# Patient Record
Sex: Male | Born: 1975 | Hispanic: No | Smoking: Current every day smoker
Health system: Southern US, Community
[De-identification: ages and names within clinical notes are randomized; demographics above are authoritative.]

## PROBLEM LIST (undated history)

## (undated) DIAGNOSIS — F319 Bipolar disorder, unspecified: Secondary | ICD-10-CM

## (undated) DIAGNOSIS — G8929 Other chronic pain: Secondary | ICD-10-CM

## (undated) DIAGNOSIS — M542 Cervicalgia: Secondary | ICD-10-CM

## (undated) DIAGNOSIS — F209 Schizophrenia, unspecified: Secondary | ICD-10-CM

## (undated) DIAGNOSIS — L732 Hidradenitis suppurativa: Secondary | ICD-10-CM

## (undated) DIAGNOSIS — F4312 Post-traumatic stress disorder, chronic: Secondary | ICD-10-CM

## (undated) DIAGNOSIS — M549 Dorsalgia, unspecified: Secondary | ICD-10-CM

## (undated) HISTORY — PX: HAND SURGERY: SHX662

---

## 2000-08-04 ENCOUNTER — Emergency Department (HOSPITAL_COMMUNITY): Admission: EM | Admit: 2000-08-04 | Discharge: 2000-08-05 | Payer: Self-pay | Admitting: *Deleted

## 2000-08-05 ENCOUNTER — Encounter: Payer: Self-pay | Admitting: *Deleted

## 2000-12-09 ENCOUNTER — Emergency Department (HOSPITAL_COMMUNITY): Admission: EM | Admit: 2000-12-09 | Discharge: 2000-12-09 | Payer: Self-pay | Admitting: Emergency Medicine

## 2001-02-10 ENCOUNTER — Inpatient Hospital Stay (HOSPITAL_COMMUNITY): Admission: EM | Admit: 2001-02-10 | Discharge: 2001-02-14 | Payer: Self-pay | Admitting: Psychiatry

## 2001-02-19 ENCOUNTER — Emergency Department (HOSPITAL_COMMUNITY): Admission: EM | Admit: 2001-02-19 | Discharge: 2001-02-19 | Payer: Self-pay | Admitting: Emergency Medicine

## 2003-07-11 ENCOUNTER — Emergency Department (HOSPITAL_COMMUNITY): Admission: EM | Admit: 2003-07-11 | Discharge: 2003-07-11 | Payer: Self-pay | Admitting: Emergency Medicine

## 2003-08-22 ENCOUNTER — Emergency Department (HOSPITAL_COMMUNITY): Admission: EM | Admit: 2003-08-22 | Discharge: 2003-08-22 | Payer: Self-pay | Admitting: *Deleted

## 2004-02-13 ENCOUNTER — Emergency Department (HOSPITAL_COMMUNITY): Admission: EM | Admit: 2004-02-13 | Discharge: 2004-02-13 | Payer: Self-pay | Admitting: Emergency Medicine

## 2004-07-09 ENCOUNTER — Emergency Department (HOSPITAL_COMMUNITY): Admission: EM | Admit: 2004-07-09 | Discharge: 2004-07-10 | Payer: Self-pay | Admitting: *Deleted

## 2004-07-11 ENCOUNTER — Ambulatory Visit: Payer: Self-pay | Admitting: Family Medicine

## 2007-04-17 ENCOUNTER — Encounter: Payer: Self-pay | Admitting: Family Medicine

## 2009-07-05 ENCOUNTER — Emergency Department (HOSPITAL_COMMUNITY)
Admission: EM | Admit: 2009-07-05 | Discharge: 2009-07-05 | Payer: Self-pay | Source: Home / Self Care | Admitting: Emergency Medicine

## 2009-08-13 ENCOUNTER — Emergency Department (HOSPITAL_COMMUNITY): Admission: EM | Admit: 2009-08-13 | Discharge: 2009-08-13 | Payer: Self-pay | Admitting: Emergency Medicine

## 2010-05-02 ENCOUNTER — Emergency Department (HOSPITAL_COMMUNITY)
Admission: EM | Admit: 2010-05-02 | Discharge: 2010-05-03 | Payer: Self-pay | Source: Home / Self Care | Admitting: Emergency Medicine

## 2010-05-16 NOTE — Letter (Signed)
Summary: rpc chart  rpc chart   Imported By: Curtis Sites 01/30/2010 10:16:06  _____________________________________________________________________  External Attachment:    Type:   Image     Comment:   External Document

## 2010-05-26 ENCOUNTER — Emergency Department (HOSPITAL_COMMUNITY)
Admission: EM | Admit: 2010-05-26 | Discharge: 2010-05-26 | Disposition: A | Discharge status: Other Acute Inpatient Hospital | Payer: Medicare Other | Attending: Emergency Medicine | Admitting: Emergency Medicine

## 2010-05-26 ENCOUNTER — Emergency Department (HOSPITAL_COMMUNITY): Payer: Medicare Other

## 2010-05-26 DIAGNOSIS — W320XXA Accidental handgun discharge, initial encounter: Secondary | ICD-10-CM | POA: Insufficient documentation

## 2010-05-26 DIAGNOSIS — S62309B Unspecified fracture of unspecified metacarpal bone, initial encounter for open fracture: Secondary | ICD-10-CM | POA: Insufficient documentation

## 2010-05-26 LAB — BASIC METABOLIC PANEL
BUN: 8 mg/dL (ref 6–23)
Creatinine, Ser: 0.68 mg/dL (ref 0.4–1.5)
GFR calc non Af Amer: >60 mL/min (ref >60)

## 2010-05-26 LAB — CBC
HCT: 37.4 % — ABNORMAL LOW (ref 39.0–52.0)
Hemoglobin: 13.3 g/dL (ref 13.0–17.0)
MCH: 31.9 pg (ref 26.0–34.0)
MCHC: 35.6 g/dL (ref 30.0–36.0)
MCV: 89.7 fL (ref 78.0–100.0)
Platelets: 189 10*3/uL (ref 150–400)
RBC: 4.17 MIL/uL — ABNORMAL LOW (ref 4.22–5.81)
RDW: 13.1 % (ref 11.5–15.5)
WBC: 8.4 10*3/uL (ref 4.0–10.5)

## 2010-05-26 LAB — DIFFERENTIAL
Basophils Absolute: 0 10*3/uL (ref 0.0–0.1)
Basophils Relative: 0 % (ref 0–1)
Eosinophils Absolute: 0.3 10*3/uL (ref 0.0–0.7)
Eosinophils Relative: 3 % (ref 0–5)
Lymphocytes Relative: 47 % — ABNORMAL HIGH (ref 12–46)
Lymphs Abs: 4 10*3/uL (ref 0.7–4.0)
Monocytes Absolute: 0.7 10*3/uL (ref 0.1–1.0)
Monocytes Relative: 8 % (ref 3–12)
Neutro Abs: 3.4 10*3/uL (ref 1.7–7.7)
Neutrophils Relative %: 41 % — ABNORMAL LOW (ref 43–77)

## 2010-06-10 ENCOUNTER — Emergency Department (HOSPITAL_COMMUNITY)
Admission: EM | Admit: 2010-06-10 | Discharge: 2010-06-10 | Disposition: A | Discharge status: Home / Self Care | Payer: Medicare Other | Attending: Emergency Medicine | Admitting: Emergency Medicine

## 2010-06-10 DIAGNOSIS — F988 Other specified behavioral and emotional disorders with onset usually occurring in childhood and adolescence: Secondary | ICD-10-CM | POA: Insufficient documentation

## 2010-06-10 DIAGNOSIS — Z760 Encounter for issue of repeat prescription: Secondary | ICD-10-CM | POA: Insufficient documentation

## 2010-06-10 DIAGNOSIS — M79609 Pain in unspecified limb: Secondary | ICD-10-CM | POA: Insufficient documentation

## 2010-09-01 NOTE — Discharge Summary (Signed)
[facility]  Patient:    Ricky Campbell, Ricky Campbell Visit Number: [id number] MRN: [id number]          Service Type: EMS Location: ED Attending Physician:  Annamarie Dawley Dictated by:   Jeanice Lim, M.D. Admit Date:  02/19/2001 Discharge Date: 02/19/2001                             Discharge Summary  IDENTIFYING DATA:  This is a 35 year old separated white male voluntarily admitted for depression and suicidal ideation.  History of previous diagnosis of bipolar at [facility] in 1997 and 1998 for depression, history of one suicide attempt.  Followed up at [facility]. Treated with Seroquel.  ALLERGIES:   TETRACYCLINE.  PHYSICAL EXAMINATION:  GENERAL:  Essentially within normal limits.  In no acute distress.  VITAL SIGNS:  Stable, afebrile.  NEUROLOGIC:  Nonfocal, grossly intact.  ROUTINE ADMISSION LABORATORY DATA:  Positive for THC; otherwise normal except for urinalysis which had large hemoglobin.  CBC was within normal limits.  MENTAL STATUS EXAMINATION ON ADMISSION:  The patient was alert and  oriented, neatly dressed with multiple piercings, cooperative, without psychomotor abnormalities.  Speech: Within normal limits.  Mood: Depressed.  Affect: Somewhat restricted.  Thought process: Goal directed.  Thought content: Negative for psychotic symptoms.  He denied acute suicidal, homicidal, or violent ideations.  Judgment and insight were poor based on history. Cognitive: Intact.  ADMITTING DIAGNOSES: Axis I:    1. Bipolar disorder, depressed.            2. Anxiety disorder, not otherwise specified. Axis II:   None. Axis III:  None. Axis IV:   Problems with primary support and other psychosocial problems, mild            to moderate. Axis V:    30/70.  HOSPITAL COURSE:  The patient was admitted and started on routine p.r.n. medications and his previous medications were resumed including  Xanax, Seroquel, and Flexeril.  Xanax was scheduled and Seroquel optimized.  He was started on Trileptal for significant anxiety and lability and tolerated these changes well with no side effects and reported improvement of mood stability and improved judgment and insight.  CONDITION AT DISCHARGE:  Mood was mostly euthymic.  Affect: Brighter.  Thought process: Goal directed.  Thought content: Negative for psychotic symptoms. There was no dangerous ideation and he reported motivated to be compliant with the the followup plan.  DISPOSITION:  He was discharged to follow up with [facility], appointment February 25, 2001, at 9 a.m.  DISCHARGE MEDICATIONS: 1. Xanax 1 mg q.a.m., half at 12 p.m., half at 4 p.m., and one q.h.s. 2. Seroquel 100 mg q.h.s. and 25 mg q.a.m. and 4 p.m. 3. Trileptal 150 mg b.i.d. and two q.h.s. 4. Flexeril 10 mg q.12h. p.r.n.  DISCHARGE DIAGNOSES: Axis I:    1. Bipolar disorder, depressed.            2. Anxiety disorder, not otherwise specified. Axis II:   None. Axis III:  None. Axis IV:   Problems with primary support and other psychosocial problems, mild            to moderate. Axis V:    Global assessment of functioning on discharge 50-55. Dictated by:   Jeanice Lim, M.D. Attending Physician:  Annamarie Dawley DD:  03/21/01 TD:  03/21/01 Job: [id number]
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
## 2016-01-06 ENCOUNTER — Emergency Department
Admission: EM | Admit: 2016-01-06 | Discharge: 2016-01-07 | Disposition: A | Discharge status: Other Acute Inpatient Hospital | Payer: Medicare PPO | Attending: Emergency Medicine | Admitting: Emergency Medicine

## 2016-01-06 ENCOUNTER — Encounter: Payer: Self-pay | Admitting: Urgent Care

## 2016-01-06 DIAGNOSIS — F17200 Nicotine dependence, unspecified, uncomplicated: Secondary | ICD-10-CM | POA: Insufficient documentation

## 2016-01-06 DIAGNOSIS — R079 Chest pain, unspecified: Secondary | ICD-10-CM | POA: Insufficient documentation

## 2016-01-06 DIAGNOSIS — F3162 Bipolar disorder, current episode mixed, moderate: Secondary | ICD-10-CM | POA: Insufficient documentation

## 2016-01-06 DIAGNOSIS — L732 Hidradenitis suppurativa: Secondary | ICD-10-CM | POA: Insufficient documentation

## 2016-01-06 DIAGNOSIS — F1210 Cannabis abuse, uncomplicated: Secondary | ICD-10-CM | POA: Insufficient documentation

## 2016-01-06 DIAGNOSIS — F209 Schizophrenia, unspecified: Secondary | ICD-10-CM | POA: Insufficient documentation

## 2016-01-06 HISTORY — DX: Cervicalgia: M54.2

## 2016-01-06 HISTORY — DX: Schizophrenia, unspecified: F20.9

## 2016-01-06 HISTORY — DX: Other chronic pain: G89.29

## 2016-01-06 HISTORY — DX: Dorsalgia, unspecified: M54.9

## 2016-01-06 HISTORY — DX: Hidradenitis suppurativa: L73.2

## 2016-01-06 HISTORY — DX: Bipolar disorder, unspecified: F31.9

## 2016-01-06 LAB — COMPREHENSIVE METABOLIC PANEL
ALT: 22 U/L (ref 17–63)
AST: 19 U/L (ref 15–41)
Albumin: 4.5 g/dL (ref 3.5–5.0)
Alkaline Phosphatase: 73 U/L (ref 38–126)
Anion gap: 7 (ref 5–15)
BILIRUBIN TOTAL: 0.6 mg/dL (ref 0.3–1.2)
BUN: 7 mg/dL (ref 6–20)
CO2: 30 mmol/L (ref 22–32)
CREATININE: 1.17 mg/dL (ref 0.61–1.24)
Calcium: 9.3 mg/dL (ref 8.9–10.3)
Chloride: 99 mmol/L — ABNORMAL LOW (ref 101–111)
Glucose, Bld: 120 mg/dL — ABNORMAL HIGH (ref 65–99)
POTASSIUM: 4.1 mmol/L (ref 3.5–5.1)
Sodium: 136 mmol/L (ref 135–145)
TOTAL PROTEIN: 7.6 g/dL (ref 6.5–8.1)

## 2016-01-06 LAB — CBC
HCT: 47.8 % (ref 40.0–52.0)
Hemoglobin: 16.6 g/dL (ref 13.0–18.0)
MCH: 31 pg (ref 26.0–34.0)
MCHC: 34.7 g/dL (ref 32.0–36.0)
MCV: 89.2 fL (ref 80.0–100.0)
PLATELETS: 246 10*3/uL (ref 150–440)
RBC: 5.35 MIL/uL (ref 4.40–5.90)
RDW: 13.9 % (ref 11.5–14.5)
WBC: 11.8 10*3/uL — ABNORMAL HIGH (ref 3.8–10.6)

## 2016-01-06 LAB — URINE DRUG SCREEN, QUALITATIVE (ARMC ONLY)
Amphetamines, Ur Screen: NOT DETECTED
BARBITURATES, UR SCREEN: NOT DETECTED
BENZODIAZEPINE, UR SCRN: NOT DETECTED
CANNABINOID 50 NG, UR ~~LOC~~: POSITIVE — AB
Cocaine Metabolite,Ur ~~LOC~~: NOT DETECTED
MDMA (Ecstasy)Ur Screen: NOT DETECTED
Methadone Scn, Ur: NOT DETECTED
Opiate, Ur Screen: POSITIVE — AB
PHENCYCLIDINE (PCP) UR S: NOT DETECTED
Tricyclic, Ur Screen: NOT DETECTED

## 2016-01-06 LAB — TROPONIN I

## 2016-01-06 LAB — ETHANOL

## 2016-01-06 LAB — SALICYLATE LEVEL

## 2016-01-06 LAB — ACETAMINOPHEN LEVEL: Acetaminophen (Tylenol), Serum: <10 ug/mL — ABNORMAL LOW (ref 10–30)

## 2016-01-06 NOTE — ED Triage Notes (Addendum)
Patient presents to the ED tonight asking for refills on his medications. PMH significant for Bipolar and Schizophrenia. Patient is off several medications; requesting: 1. Lamical 2. Effexor 3. Adderall 30mg  BID 4. Xanax 2mg  TID 5. Oxycodone 15mg  8 times a day 6. Keflex  Reports that he has been having chest pain and LUE pain over the last two days. Patient itching, having insomnia (hasnt slept in 3 days), and having A/V hallucinations. Patient has not left his home in months; family member made him come in because of the condition of his home. Patient reports that he has lost a family member this year and his doctor has moved away. Patient reports that he has seen several physicans, but no one will fill his medications; [place].  Being treated for what sounds like hidradenitis suppurativa in his groin; on Keflex.

## 2016-01-06 NOTE — ED Provider Notes (Signed)
[facility] Emergency Department Provider Note   ____________________________________________   First MD Initiated Contact with Patient 01/06/16 2337     (approximate)  I have reviewed the triage vital signs and the nursing notes.   HISTORY  Chief Complaint Medication Refill; Psychiatric Evaluation; Chest Pain; and Recurrent Skin Infections    HPI Ricky Campbell is a 40 y.o. male who presents to the ED from home with the chief complaint of hallucinations. Patient has a history of bipolar disorder and schizophrenia who states he is off of his medications for the past several months. States he is having hallucinations; denies active SI/HI/AH/VH. He is on his second course of Keflex for hidradenitis suppurativa in his right groin.States he has been having chest pains for the past 3 days. Symptoms are not associated with diaphoresis, dizziness, nausea, vomiting. Also reports insomnia; states he has not slept for the past 3 days. Denies recent fever, chills, shortness of breath, abdominal pain, dysuria, diarrhea. Denies recent travel or trauma. Nothing makes his symptoms better or worse. States he has had chest pains previously when he has been off of his medications.   Past Medical History:  Diagnosis Date  . Bipolar disorder (HCC)   . Chronic neck and back pain   . Hidradenitis   . Schizophrenia (HCC)     There are no active problems to display for this patient.   Past Surgical History:  Procedure Laterality Date  . HAND SURGERY      Prior to Admission medications   Not on File    Allergies Tetracyclines & related  No family history on file.  Social History Social History  Substance Use Topics  . Smoking status: Current Every Day Smoker  . Smokeless tobacco: Never Used  . Alcohol use Yes    Review of Systems  Constitutional: No fever/chills. Eyes: No visual changes. ENT: No sore throat. Cardiovascular: Positive for chest  pain. Respiratory: Denies shortness of breath. Gastrointestinal: No abdominal pain.  No nausea, no vomiting.  No diarrhea.  No constipation. Genitourinary: Negative for dysuria. Musculoskeletal: Negative for back pain. Skin: Negative for rash. Neurological: Negative for headaches, focal weakness or numbness. Psychiatric:Positive for hallucinations.  10-point ROS otherwise negative.  ____________________________________________   PHYSICAL EXAM:  VITAL SIGNS: ED Triage Vitals [01/06/16 2150]  Enc Vitals Group     BP (!) 138/102     Pulse Rate 89     Resp 18     Temp 98.1 F (36.7 C)     Temp Source Oral     SpO2 98 %     Weight 180 lb (81.6 kg)     Height 5\' 11"  (1.803 m)     Head Circumference      Peak Flow      Pain Score 8     Pain Loc      Pain Edu?      Excl. in GC?     Constitutional: Alert and oriented. Well appearing and in no acute distress. Eyes: Conjunctivae are normal. PERRL. EOMI. Head: Atraumatic. Nose: No congestion/rhinnorhea. Mouth/Throat: Mucous membranes are moist.  Oropharynx non-erythematous. Neck: No stridor.   Cardiovascular: Normal rate, regular rhythm. Grossly normal heart sounds.  Good peripheral circulation. Respiratory: Normal respiratory effort.  No retractions. Lungs CTAB. Gastrointestinal: Soft and nontender. No distention. No abdominal bruits. No CVA tenderness. Genitourinary: Right groin pain with multiple scabs from hidradenitis. There is no active abscess or fluctuance. Musculoskeletal: No lower extremity tenderness nor edema.  No  joint effusions. Neurologic:  Normal speech and language. No gross focal neurologic deficits are appreciated. No gait instability. Skin:  Skin is warm, dry and intact. No rash noted. Psychiatric: Mood and affect are flat. Speech and behavior are normal.  ____________________________________________   LABS (all labs ordered are listed, but only abnormal results are displayed)  Labs Reviewed    COMPREHENSIVE METABOLIC PANEL - Abnormal; Notable for the following:       Result Value   Chloride 99 (*)    Glucose, Bld 120 (*)    All other components within normal limits  ACETAMINOPHEN LEVEL - Abnormal; Notable for the following:    Acetaminophen (Tylenol), Serum <10 (*)    All other components within normal limits  CBC - Abnormal; Notable for the following:    WBC 11.8 (*)    All other components within normal limits  URINE DRUG SCREEN, QUALITATIVE ([facility] ONLY) - Abnormal; Notable for the following:    Opiate, Ur Screen POSITIVE (*)    Cannabinoid 50 Ng, Ur [name] POSITIVE (*)    All other components within normal limits  ETHANOL  SALICYLATE LEVEL  TROPONIN I  TROPONIN I   ____________________________________________  EKG  ED ECG REPORT I, SUNG,JADE J, the attending physician, personally viewed and interpreted this ECG.   Date: 01/07/2016  EKG Time: 2203  Rate: 89  Rhythm: normal EKG, normal sinus rhythm  Axis: Within normal limits  Intervals:none  ST&T Change: Nonspecific  ____________________________________________  RADIOLOGY  None ____________________________________________   PROCEDURES  Procedure(s) performed: None  Procedures  Critical Care performed: No  ____________________________________________   INITIAL IMPRESSION / ASSESSMENT AND PLAN / ED COURSE  Pertinent labs & imaging results that were available during my care of the patient were reviewed by me and considered in my medical decision making (see chart for details).  40 year old male with a history of bipolar disorder and schizophrenia who is off his medications and having hallucinations. Also complaining of nonspecific chest pain for the past several days. Initial troponin and EKG are unremarkable. Will repeat second troponin. We'll keep patient voluntary pending TTS and psychiatry consult.  Clinical Course  Comment By Time  Spoke with TTS [name] who recommends inpatient admission.  Patient will be kept Voluntary and be admitted to our behavioral health unit. Irean HongJade J Sung, MD 09/23 0340     ____________________________________________   FINAL CLINICAL IMPRESSION(S) / ED DIAGNOSES  Final diagnoses:  Bipolar disorder, current episode mixed, moderate (HCC)  Schizophrenia, unspecified type (HCC)  Marijuana abuse      NEW MEDICATIONS STARTED DURING THIS VISIT:  New Prescriptions   No medications on file     Note:  This document was prepared using Dragon voice recognition software and may include unintentional dictation errors.    Irean HongJade J Sung, MD 01/07/16 [phone]

## 2016-01-06 NOTE — ED Notes (Signed)
Belongings given to patient's sister per his request by EDT; among items were his cell phone and wallet.

## 2016-01-07 ENCOUNTER — Inpatient Hospital Stay
Admit: 2016-01-07 | Discharge: 2016-01-10 | DRG: 885 | Disposition: A | Discharge status: Home / Self Care | Payer: Medicare PPO | Source: Intra-hospital | Attending: Psychiatry | Admitting: Psychiatry

## 2016-01-07 DIAGNOSIS — F17210 Nicotine dependence, cigarettes, uncomplicated: Secondary | ICD-10-CM | POA: Diagnosis present

## 2016-01-07 DIAGNOSIS — Z888 Allergy status to other drugs, medicaments and biological substances status: Secondary | ICD-10-CM | POA: Diagnosis not present

## 2016-01-07 DIAGNOSIS — M549 Dorsalgia, unspecified: Secondary | ICD-10-CM | POA: Diagnosis present

## 2016-01-07 DIAGNOSIS — F3130 Bipolar disorder, current episode depressed, mild or moderate severity, unspecified: Secondary | ICD-10-CM | POA: Diagnosis present

## 2016-01-07 DIAGNOSIS — R45850 Homicidal ideations: Secondary | ICD-10-CM | POA: Diagnosis present

## 2016-01-07 DIAGNOSIS — Z9114 Patient's other noncompliance with medication regimen: Secondary | ICD-10-CM | POA: Diagnosis not present

## 2016-01-07 DIAGNOSIS — F1190 Opioid use, unspecified, uncomplicated: Secondary | ICD-10-CM | POA: Diagnosis present

## 2016-01-07 DIAGNOSIS — F319 Bipolar disorder, unspecified: Principal | ICD-10-CM | POA: Diagnosis present

## 2016-01-07 DIAGNOSIS — F17200 Nicotine dependence, unspecified, uncomplicated: Secondary | ICD-10-CM | POA: Diagnosis present

## 2016-01-07 DIAGNOSIS — Z89022 Acquired absence of left finger(s): Secondary | ICD-10-CM

## 2016-01-07 DIAGNOSIS — R45851 Suicidal ideations: Secondary | ICD-10-CM | POA: Diagnosis present

## 2016-01-07 DIAGNOSIS — F1220 Cannabis dependence, uncomplicated: Secondary | ICD-10-CM | POA: Diagnosis present

## 2016-01-07 DIAGNOSIS — L732 Hidradenitis suppurativa: Secondary | ICD-10-CM | POA: Diagnosis present

## 2016-01-07 DIAGNOSIS — Z9889 Other specified postprocedural states: Secondary | ICD-10-CM | POA: Diagnosis not present

## 2016-01-07 DIAGNOSIS — F4312 Post-traumatic stress disorder, chronic: Secondary | ICD-10-CM

## 2016-01-07 DIAGNOSIS — Z915 Personal history of self-harm: Secondary | ICD-10-CM

## 2016-01-07 DIAGNOSIS — F1120 Opioid dependence, uncomplicated: Secondary | ICD-10-CM | POA: Diagnosis present

## 2016-01-07 DIAGNOSIS — G8929 Other chronic pain: Secondary | ICD-10-CM | POA: Diagnosis present

## 2016-01-07 DIAGNOSIS — F1021 Alcohol dependence, in remission: Secondary | ICD-10-CM | POA: Diagnosis present

## 2016-01-07 DIAGNOSIS — M542 Cervicalgia: Secondary | ICD-10-CM | POA: Diagnosis present

## 2016-01-07 DIAGNOSIS — E7800 Pure hypercholesterolemia, unspecified: Secondary | ICD-10-CM | POA: Diagnosis present

## 2016-01-07 HISTORY — DX: Post-traumatic stress disorder, chronic: F43.12

## 2016-01-07 LAB — TROPONIN I: Troponin I: <0.03 ng/mL (ref <0.03)

## 2016-01-07 MED ORDER — TRAZODONE HCL 100 MG PO TABS: 100.0000 mg | ORAL_TABLET | Freq: Every evening | ORAL | Status: DC | PRN

## 2016-01-07 MED ORDER — MAGNESIUM HYDROXIDE 400 MG/5ML PO SUSP: 30.0000 mL | Freq: Every day | ORAL | Status: DC | PRN

## 2016-01-07 MED ORDER — OXYCODONE HCL 5 MG PO TABS
15.0000 mg | ORAL_TABLET | Freq: Two times a day (BID) | ORAL | Status: DC
  Administered 2016-01-07: 15 mg via ORAL
  Filled 2016-01-07: qty 3

## 2016-01-07 MED ORDER — NICOTINE 21 MG/24HR TD PT24
21.0000 mg | MEDICATED_PATCH | Freq: Every day | TRANSDERMAL | Status: DC
  Administered 2016-01-07 – 2016-01-10 (×4): 21 mg via TRANSDERMAL
  Filled 2016-01-07 (×4): qty 1

## 2016-01-07 MED ORDER — CEPHALEXIN 500 MG PO CAPS
500.0000 mg | ORAL_CAPSULE | Freq: Two times a day (BID) | ORAL | Status: DC
  Administered 2016-01-07 – 2016-01-10 (×7): 500 mg via ORAL
  Filled 2016-01-07 (×7): qty 1

## 2016-01-07 MED ORDER — LORAZEPAM 0.5 MG PO TABS
1.0000 mg | ORAL_TABLET | Freq: Four times a day (QID) | ORAL | Status: DC | PRN
  Administered 2016-01-07 – 2016-01-09 (×2): 1 mg via ORAL
  Filled 2016-01-07 (×2): qty 2

## 2016-01-07 MED ORDER — ACETAMINOPHEN 325 MG PO TABS: 650.0000 mg | ORAL_TABLET | Freq: Four times a day (QID) | ORAL | Status: DC | PRN

## 2016-01-07 MED ORDER — HALOPERIDOL 5 MG PO TABS
5.0000 mg | ORAL_TABLET | Freq: Every day | ORAL | Status: DC
  Administered 2016-01-07 – 2016-01-09 (×3): 5 mg via ORAL
  Filled 2016-01-07 (×3): qty 1

## 2016-01-07 MED ORDER — HYDROCODONE-ACETAMINOPHEN 5-325 MG PO TABS
1.0000 | ORAL_TABLET | Freq: Four times a day (QID) | ORAL | Status: DC | PRN
  Administered 2016-01-07 – 2016-01-10 (×7): 1 via ORAL
  Filled 2016-01-07 (×9): qty 1

## 2016-01-07 MED ORDER — ALUM & MAG HYDROXIDE-SIMETH 200-200-20 MG/5ML PO SUSP: 30.0000 mL | ORAL | Status: DC | PRN

## 2016-01-07 MED ORDER — LAMOTRIGINE 100 MG PO TABS
100.0000 mg | ORAL_TABLET | Freq: Two times a day (BID) | ORAL | Status: DC
  Administered 2016-01-07 – 2016-01-09 (×5): 100 mg via ORAL
  Filled 2016-01-07 (×5): qty 1

## 2016-01-07 MED ORDER — VENLAFAXINE HCL ER 75 MG PO CP24
75.0000 mg | ORAL_CAPSULE | Freq: Every day | ORAL | Status: DC
  Administered 2016-01-07 – 2016-01-08 (×2): 75 mg via ORAL
  Filled 2016-01-07 (×2): qty 1
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
## 2016-01-07 NOTE — H&P (Signed)
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 symptoms so as ready for discharge  Short Term Goals: Ability to identify changes in lifestyle to reduce recurrence of condition will improve, Ability to verbalize feelings will improve, Ability to disclose and discuss suicidal ideas, Ability to demonstrate self-control will improve, Ability to identify and develop effective coping behaviors will improve, Ability to maintain clinical measurements within normal limits will improve, Compliance with prescribed medications will improve and Ability to identify triggers associated with substance abuse/mental health issues will improve  I certify that inpatient services furnished can reasonably be expected to improve the patient's condition.    Jay Schlichter, MD 9/23/20173:08 PM

## 2016-01-07 NOTE — Plan of Care (Signed)
Problem: Safety: Goal: Periods of time without injury will increase Outcome: Progressing Patient has remained free from harm this shift. Patient provided with a safe environment.  Patient safety maintained with 15 minute checks.

## 2016-01-07 NOTE — Tx Team (Addendum)
Initial Treatment Plan 01/07/2016 5:47 AM Ricky Cadethristopher J Bergdoll EPP:[id number]    PATIENT STRESSORS: Medication change or noncompliance   PATIENT STRENGTHS: Ability for insight Average or above average intelligence Communication skills   PATIENT IDENTIFIED PROBLEMS:   Depression  Medication management       "Depression"  "help obtaining and getting on right medications"         DISCHARGE CRITERIA:  Improved stabilization in mood, thinking, and/or behavior  PRELIMINARY DISCHARGE PLAN: Outpatient therapy Return to previous living arrangement  PATIENT/FAMILY INVOLVEMENT: This treatment plan has been presented to and reviewed with the patient, Ricky Campbell, and/or family member, .  The patient and family have been given the opportunity to ask questions and make suggestions.  Ernesto RutherfordKristen Marsia Cino, RN 01/07/2016, 5:47 AM

## 2016-01-07 NOTE — [facility] Group Notes (Signed)
[facility] LCSW Group Therapy  01/07/2016 2:15 PM  Type of Therapy:  Group Therapy  Participation Level:  Patient did not attend group. CSW invited patient to group.   Summary of Progress/Problems: Communications: Patients identify how individuals communicate with one another appropriately and inappropriately. Patients will be guided to discuss their thoughts, feelings, and behaviors related to barriers when communicating. The group will process together ways to execute positive and appropriate communications.   Ricky Campbell G. Garnette CzechSampson MSW, LCSWA 01/07/2016, 2:18 PM
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
## 2016-01-07 NOTE — Progress Notes (Signed)
D:  Patient isolated himself to his room.  Patient denies SI/AVH/HI. A:  Patient encouraged to attend groups.  Patient administered scheduled medications. R:  Patient safety maintained with 15 minute checks.  Patient did not attend groups.  Patient medication compliant.

## 2016-01-07 NOTE — ED Notes (Signed)
Reports called to lower level behavioral.  Pt accepted.

## 2016-01-07 NOTE — B[facility] Counselor (Signed)
CSW went to patient's room to meet and complete psychosocial assessment. Patient stated he was feeling nauseous and reports he had not been able to sleep when he was in the ED. Patient declines to complete assessment today. CSW will attempt to complete psychosocial assessment tomorrow.   Kendall Arnell G. Garnette CzechSampson MSW, [facility] Lake[facility]WA 01/07/2016 3:45 PM

## 2016-01-08 LAB — TSH: TSH: 1.324 u[IU]/mL (ref 0.350–4.500)

## 2016-01-08 LAB — LIPID PANEL
CHOL/HDL RATIO: 7.1 ratio
CHOLESTEROL: 249 mg/dL — AB (ref 0–200)
HDL: 35 mg/dL — ABNORMAL LOW (ref >40)
LDL Cholesterol: 167 mg/dL — ABNORMAL HIGH (ref 0–99)
Triglycerides: 236 mg/dL — ABNORMAL HIGH (ref <150)
VLDL: 47 mg/dL — ABNORMAL HIGH (ref 0–40)

## 2016-01-08 MED ORDER — VENLAFAXINE HCL ER 75 MG PO CP24
150.0000 mg | ORAL_CAPSULE | Freq: Every day | ORAL | Status: DC
  Administered 2016-01-09 – 2016-01-10 (×2): 150 mg via ORAL
  Filled 2016-01-08 (×2): qty 2

## 2016-01-08 NOTE — [facility] Group Notes (Signed)
[facility] Group Notes:  (Nursing/MHT/Case Management/Adjunct)  Date:  01/08/2016  Time:  4:06 AM  Type of Therapy:  Psychoeducational Skills  Participation Level:  Active  Participation Quality:  Appropriate, Attentive and Sharing  Affect:  Appropriate  Cognitive:  Appropriate  Insight:  Appropriate  Engagement in Group:  Engaged  Modes of Intervention:  Discussion, Socialization and Support  Summary of Progress/Problems:  Chancy MilroyLaquanda Y Hiilei Gerst 01/08/2016, 4:06 AM

## 2016-01-08 NOTE — Plan of Care (Signed)
Problem: Activity: Goal: Interest or engagement in leisure activities will improve Outcome: Not Progressing Does not attend groups. Isolative to room unless meal or medication time. Noted to be in bed with eyes closed most of the day. Support and encouragement provided. Will continue to monitor.

## 2016-01-08 NOTE — Progress Notes (Signed)
Pt denies SI/HI/AVH. Affect irritable and Pt states it is because " I came here to get my medicine straight, not for it to be messed up even more". Attended evening group.  Rated pain 10/10. Stated that his prescribed PRN medication would not work. Currently resting in bed with eyes closed. Voices no additional concerns at this time. Safety maintained.

## 2016-01-08 NOTE — [facility] Suicide Risk Assessment (Signed)
[facility] INPATIENT:  Family/Significant Other Suicide Prevention Education  Suicide Prevention Education:  Education Completed;Ricky Campbell (sister [phone]), has been identified by the patient as the family member/significant other with whom the patient will be residing, and identified as the person(s) who will aid the patient in the event of a mental health crisis (suicidal ideations/suicide attempt).  With written consent from the patient, the family member/significant other has been provided the following suicide prevention education, prior to the and/or following the discharge of the patient. Sister confirmed that patients son is living with her temporarily and that the son is safe. Sister confirms that patients living arrangement is safe and that he has no financial or housing stressors. Sister confirms that she will pick patient up at discharge.    The suicide prevention education provided includes the following:  Suicide risk factors  Suicide prevention and interventions  National Suicide Hotline telephone number  [facility] assessment telephone number  [place] Emergency Assistance 911  [facility] and/or Residential Mobile Crisis Unit telephone number  Request made of family/significant other to:  Remove weapons (e.g., guns, rifles, knives), all items previously/currently identified as safety concern.    Remove drugs/medications (over-the-counter, prescriptions, illicit drugs), all items previously/currently identified as a safety concern.  The family member/significant other verbalizes understanding of the suicide prevention education information provided.  The family member/significant other agrees to remove the items of safety concern listed above.  Ricky Campbell MSW, LCSWA 01/08/2016, 10:48 AM

## 2016-01-08 NOTE — [facility] Group Notes (Signed)
[facility] LCSW Group Therapy  01/08/2016 2:02 PM  Type of Therapy:  Group Therapy  Participation Level:  Patient did not attend group. CSW invited patient to group.   Summary of Progress/Problems:Self esteem: Patients discussed self esteem and how it impacts them. They discussed what aspects in their lives has influenced their self esteem. They were challenged to identify changes that are needed in order to improve self esteem. Patients participated in activity where they had to identify positive adjectives they felt described their personality. Patients shared with the group on the following areas: Things I am good at, What I like about my appearance, I've helped others by, What I value the most, compliments I have received, challenges I have overcome, thing that make me unique, and Times I've made others happy.    Shanta Hartner G. Garnette CzechSampson MSW, LCSWA 01/08/2016, 2:04 PM

## 2016-01-08 NOTE — Progress Notes (Signed)
Quiet, isolative today. Leaves room for meds and meals only. Denies SI/HI/AVH. Reports pain consistent at a 7/10 to lower back. Continues to report that he's here "to get his meds straight," but reports "all my meds are messed up now that I've come here."  Does not attend groups. Medication compliant. Safety maintained.   Support and encouragement provided. Medications administered as ordered to include PRN pain med. Encouraged group attendance. Checks every 15 minutes for safety. Will continue to monitor.
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
## 2016-01-09 DIAGNOSIS — F3130 Bipolar disorder, current episode depressed, mild or moderate severity, unspecified: Secondary | ICD-10-CM

## 2016-01-09 LAB — HEMOGLOBIN A1C
Hgb A1c MFr Bld: 5.5 % (ref 4.8–5.6)
Mean Plasma Glucose: 111 mg/dL

## 2016-01-09 MED ORDER — LAMOTRIGINE 100 MG PO TABS
200.0000 mg | ORAL_TABLET | Freq: Two times a day (BID) | ORAL | Status: DC
  Administered 2016-01-09 – 2016-01-10 (×2): 200 mg via ORAL
  Filled 2016-01-09 (×2): qty 2

## 2016-01-09 NOTE — Progress Notes (Signed)
Recreation Therapy Notes  Date: 09.25.17 Time: 1:00 pm Location: Craft Room  Group Topic: Wellness  Goal Area(s) Addresses:  Patient will identify at least one item per dimension of health. Patient will examine areas they are deficient in.  Behavioral Response: Did not attend  Intervention: 6 Dimensions of Health  Activity: Patients were definition sheets of the 6 dimensions of health and a worksheet with the dimensions on it. Patients were instructed to write 2-3 items in each category they were currently doing.  Education: LRT educated patients on ways to improve each dimension.  Education Outcome: Patient did not attend group.   Clinical Observations/Feedback: Patient did not attend group.  Jacquelynn CreeGreene,Marycruz Boehner M, LRT/CTRS 01/09/2016 3:24 PM

## 2016-01-09 NOTE — Plan of Care (Signed)
Problem: Safety: Goal: Ability to remain free from injury will improve Outcome: Progressing Patient denies SI and has remained free from injury during this shift.

## 2016-01-09 NOTE — Plan of Care (Signed)
Problem: Activity: Goal: Imbalance in normal sleep/wake cycle will improve Outcome: Progressing Pt reports much better sleep, and contributes his lack of sleep before admission to taking Effexor at night, and to being reason for admission. Support and encouragement provided. Will continue to monitor.
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
## 2016-01-09 NOTE — Progress Notes (Signed)
D: Patient with a blunted affect. Denies SI/HI/AVH. States he's here because he needed his medications fixed. Also states he's ready for discharge. Rates lower back pain at an 8. Pleasant. Went to group.  A: Medication given with education. PRN hydrocodone given. Encouragement provided.  R: Patient was compliant with medication. He has remained calm and cooperative. Safety maintained with 15 min checks.

## 2016-01-09 NOTE — Progress Notes (Signed)
D: Pt denies SI/HI/AVH. Pt is pleasant and cooperative, no bizarre behavior noted. Pt appears less anxious and he is interacting with peers and staff appropriately.  A: Pt was offered support and encouragement. Pt was given scheduled medications. Pt was encouraged to attend groups. Q 15 minute checks were done for safety.  R:Pt attends groups and interacts well with peers and staff. Pt is taking medication. Pt has no complaints.Pt receptive to treatment and safety maintained on unit.

## 2016-01-09 NOTE — Progress Notes (Signed)
Recreation Therapy Notes  INPATIENT RECREATION THERAPY ASSESSMENT  Patient Details Name: Ricky CadetChristopher J Campbell MRN: [id number] DOB: 02-Apr-1976 Today's Date: 01/09/2016  Patient Stressors:  Patient reported no stressors.  Coping Skills:   Avoidance, Exercise, Art/Dance, Music, Sports, Other (Comment) ([place]ide [place] and clear head)  Personal Challenges: Concentration, Trusting Others (Patient stated he had ADD and he trusts people until they prove him wrong)  Leisure Interests (2+):  Individual - Other (Comment) (Ride bike, work on cars)  Biochemist, clinicalAwareness of Community Resources:  Yes  Community Resources:  [place]  Current Use: Yes  If no, Barriers?:    Patient Strengths:  Good father, helpful  Patient Identified Areas of Improvement:  Being able to let his mind shut down so he can sleep and not stress over little things when he is trying to sleep  Current Recreation Participation:  Riding bike, hanging out with friends, spend time with son  Patient Goal for Hospitalization:  To get medications right  [place]  [place] Residence[place]   Current [place] (including self-harm):  No  Current HI:  No  Consent to Intern Participation: N/A   Ricky Campbell,Ricky Campbell, LRT/CTRS 01/09/2016, 3:55 PM

## 2016-01-09 NOTE — BHH Group Notes (Signed)
[facility] LCSW Group Therapy   01/09/2016 9:30 am Type of Therapy: Group Therapy   Participation Level: Pt invited but did not attend.   Participation Quality: Pt invited but did not attend.  Hampton AbbotKadijah Nyelli Samara, MSW, LCSWA 01/09/2016, 11:02AM

## 2016-01-09 NOTE — Plan of Care (Signed)
Problem: Coping: Goal: Ability to verbalize feelings will improve Outcome: Progressing Patient verbalized feelings.    

## 2016-01-09 NOTE — Progress Notes (Signed)
Pt awake, alert, up on unit today interacting appropriately with peers. Much improvement noted in being out of room and interacting. Continues to report lower back pain rated 7/10. Attended group. Reports feeling much better, brighter. Admits he had been taking his Effexor night, and blames this for no sleep that resulted to admission. Safety maintained. Medication compliant without complaint.   Support and encouragement provided. Every 15 minute checks for safety. Medications administered. Will continue to monitor.

## 2016-01-10 MED ORDER — LAMOTRIGINE 100 MG PO TABS: 200.0000 mg | ORAL_TABLET | Freq: Every day | ORAL | Status: DC

## 2016-01-10 MED ORDER — NALOXONE HCL 4 MG/0.1ML NA LIQD: NASAL | 1 refills | Status: DC

## 2016-01-10 MED ORDER — LAMOTRIGINE 200 MG PO TABS: 200.0000 mg | ORAL_TABLET | Freq: Every day | ORAL | 1 refills | Status: DC

## 2016-01-10 MED ORDER — HALOPERIDOL 5 MG PO TABS: 5.0000 mg | ORAL_TABLET | Freq: Every day | ORAL | 1 refills | Status: DC

## 2016-01-10 MED ORDER — CEPHALEXIN 500 MG PO CAPS: 500.0000 mg | ORAL_CAPSULE | Freq: Two times a day (BID) | ORAL | 0 refills | Status: DC

## 2016-01-10 MED ORDER — VENLAFAXINE HCL ER 150 MG PO CP24: 150.0000 mg | ORAL_CAPSULE | Freq: Every day | ORAL | 1 refills | Status: DC

## 2016-01-10 NOTE — [facility] Suicide Risk Assessment (Signed)
[facility]   Principal Problem: Bipolar I disorder, most recent episode depressed [facility](HCC) Discharge Diagnoses:  Patient Active Problem List   Diagnosis Date Noted  . Bipolar I disorder, most recent episode depressed (HCC) [F31.30] 01/07/2016  . Back pain [M54.9] 01/07/2016  . Hydradenitis [L73.2] 01/07/2016  . Cannabis use disorder, moderate, dependence (HCC) [F12.20] 01/07/2016  . Opioid use disorder, moderate, dependence (HCC) [F11.20] 01/07/2016  . Tobacco use disorder [F17.200] 01/07/2016  . Chronic post-traumatic stress disorder (PTSD) [F43.12] 01/07/2016    Total Time spent with patient: 30 minutes  Musculoskeletal: Strength & Muscle Tone: within normal limits Gait & Station: normal Patient leans: N/A  Psychiatric Specialty Exam: Review of Systems  All other systems reviewed and are negative.   Blood pressure 127/69, pulse 62, temperature 98.3 F (36.8 C), temperature source Oral, resp. rate 18, height 5\' 11"  (1.803 m), weight 81.6 kg (180 lb), SpO2 100 %.Body mass index is 25.1 kg/m.  General Appearance: Casual  Eye Contact::  Good  Speech:  Clear and Coherent409  Volume:  Normal  Mood:  Euthymic  Affect:  Appropriate  Thought Process:  Goal Directed  Orientation:  Full (Time, Place, and Person)  Thought Content:  WDL  Suicidal Thoughts:  No  Homicidal Thoughts:  No  Memory:  Immediate;   Fair Recent;   Fair Remote;   Fair  Judgement:  Impaired  Insight:  Shallow  Psychomotor Activity:  Normal  Concentration:  Fair  Recall:  [employer]  Fund of Knowledge:Fair  Language: Fair  Akathisia:  No  Handed:  Right  AIMS (if indicated):     Assets:  Communication Skills Desire for Improvement Financial Resources/Insurance Housing Resilience Social Support Transportation  Sleep:  Number of Hours: 7.25  Cognition: WNL  ADL's:  Intact   Mental Status Per Nursing Assessment::   On Admission:     Demographic Factors:  Male, Divorced or  widowed, Caucasian and Living alone  Loss Factors: NA  Historical Factors: Prior suicide attempts, Family history of mental illness or substance abuse and Impulsivity  Risk Reduction Factors:   Responsible for children under 42 years of age, Sense of responsibility to family and Positive social support  Continued Clinical Symptoms:  Bipolar Disorder:   Depressive phase Alcohol/Substance Abuse/Dependencies  Cognitive Features That Contribute To Risk:  None    Suicide Risk:  Minimal: No identifiable suicidal ideation.  Patients presenting with no risk factors but with morbid ruminations; may be classified as minimal risk based on the severity of the depressive symptoms  Follow-up Information    [facility]. Go today.   Contact information: [address] [place], [place] [address] Mailing Address: [address], [place], [place] [address]  Hours: Mon-Fri 8AM-5PM  Phone: [phone]  Fax: [phone]           Plan Of Car[place] tolerated.ollow-up recommendations:  Activity:  as tolerated. Diet:  low sodium heart healthy. Other:  keep follow up appointments.  Kristine LineaJolanta Kamal Jurgens, MD 01/10/2016, 9:35 AM

## 2016-01-10 NOTE — Progress Notes (Signed)
  [facility] Discharge Plan :  Will you be returning to the same living situation after discharge:  Yes,    At discharge, do you have transportation home?: Yes,    Do you have the ability to pay for your medications: Yes,     Release of information consent forms completed and in the chart;  Patient's signature needed at discharge.  Patient to Follow up at: Follow-up Information    [facility]. Go on 01/12/2016.   Why:  Please arrive between 8am-9am for hospital follow up appointment for medication management and therapy. Contact information: [address] [place], [place] [address] Mailing Address: [address], [place], [place] [address]  Hours: Mon-Fri 8AM-5PM  Phone: [phone]  Fax: [phone]           Next level of care provider has access to [facility] Link:no  Safety Planning and Suicide Prevention discussed: Yes,     Have you used any form of tobacco in the last 30 days? (Cigarettes, Smokeless Tobacco, Cigars, and/or Pipes): Yes  Has patient been referred to the Quitline?: Patient refused referral  Patient has been referred for addiction treatment: Yes  Glennon MacSara P Thom Ollinger, MSW, LCSW 01/10/2016, 4:55 PM

## 2016-01-10 NOTE — BHH Group Notes (Signed)
[facility] LCSW Group Therapy   01/10/2016 1pm  Type of Therapy: Group Therapy   Participation Level: Pt invited but did not attend.   Participation Quality: Pt invited but did not attend.   Ricky Campbell, MSW, LCSWA 01/10/2016, 2:20PM

## 2016-01-10 NOTE — Progress Notes (Signed)
Recreation Therapy Notes  INPATIENT RECREATION TR PLAN  Patient Details Name: Ricky Campbell MRN: [id number] DOB: July 12, 1975 Today's Date: 01/10/2016  Rec Therapy Plan Is patient appropriate for Therapeutic Recreation?: Yes Treatment times per week: At least once a week TR Treatment/Interventions: 1:1 session, Group participation (Comment) (Appropriate participation in daily recreational therapy tx)  Discharge Criteria Pt will be discharged from therapy if:: Treatment goals are met, Discharged Treatment plan/goals/alternatives discussed and agreed upon by:: Patient/family  Discharge Summary Short term goals set: See Care Plan Short term goals met: Complete Progress toward goals comments: One-to-one attended One-to-one attended: Stress Management Reason goals not met: N/A Therapeutic equipment acquired: None Reason patient discharged from therapy: Discharge from hospital Pt/family agrees with progress & goals achieved: Yes Date patient discharged from therapy: 01/10/16   Leonette Monarch, LRT/CTRS 01/10/2016, 2:50 PM

## 2016-01-10 NOTE — Plan of Care (Signed)
Problem: [facility] Participation in Recreation Therapeutic Interventions Goal: STG-Other Recreation Therapy Goal (Specify) STG: Stress Management - Within 3 treatment sessions, patient will verbalize understanding of the stress management techniques in one treatment session to increase stress management skills post d/c.  Outcome: Completed/Met Date Met: 01/10/16 Treatment Session 1; Completed 1 out of 1: At approximately 11:50 am, LRT met with patient in craft room. LRT educated and provided patient with handouts on stress management techniques. Patient verbalized understanding. LRT encouraged patient to read over and practice the stress management techniques.  Leonette Monarch, LRT/CTRS 09.26.17 12:03 pm

## 2016-01-10 NOTE — Progress Notes (Signed)
D:Patient aware of discharge this shift . Patient returning home . Patient received all belonging locked up . Patient denies  Suicidal  And homicidal ideations  .  A: Writer instructed on discharge criteria  . Informed  Of Discharge [place] Suicide Risk Assessment  Transitional   Record and prescriptions  given to patient . Aware  Of follow up appointment . R: Patient left unit with no questions  Or concerns  With family friends.

## 2016-01-10 NOTE — Plan of Care (Signed)
Problem: Coping: Goal: Ability to verbalize frustrations and anger appropriately will improve Outcome: Progressing Working on coping skills    

## 2016-01-10 NOTE — Tx Team (Signed)
Interdisciplinary Treatment and Diagnostic Plan Update  01/10/2016 Time of Session: 10:30am Ricky Campbell MRN: [id number]  Principal Diagnosis: Bipolar I disorder, most recent episode depressed (HCC)  Secondary Diagnoses: Principal Problem:   Bipolar I disorder, most recent episode depressed (HCC) Active Problems:   Back pain   Hydradenitis   Cannabis use disorder, moderate, dependence (HCC)   Opioid use disorder, moderate, dependence (HCC)   Tobacco use disorder   Chronic post-traumatic stress disorder (PTSD)   Current Medications:  Current Facility-Administered Medications  Medication Dose Route Frequency Provider Last Rate Last Dose  . acetaminophen (TYLENOL) tablet 650 mg  650 mg Oral Q6H PRN Jolanta B Pucilowska, MD      . alum & mag hydroxide-simeth (MAALOX/MYLANTA) 200-200-20 MG/5ML suspension 30 mL  30 mL Oral Q4H PRN Jolanta B Pucilowska, MD      . cephALEXin (KEFLEX) capsule 500 mg  500 mg Oral BID Darliss Ridgel, MD   500 mg at 01/10/16 0829  . haloperidol (HALDOL) tablet 5 mg  5 mg Oral QHS Darliss Ridgel, MD   5 mg at 01/09/16 2132  . HYDROcodone-acetaminophen (NORCO/VICODIN) 5-325 MG per tablet 1 tablet  1 tablet Oral Q6H PRN Darliss Ridgel, MD   1 tablet at 01/10/16 0829  . [START ON 01/11/2016] lamoTRIgine (LAMICTAL) tablet 200 mg  200 mg Oral QHS Jolanta B Pucilowska, MD      . magnesium hydroxide (MILK OF MAGNESIA) suspension 30 mL  30 mL Oral Daily PRN Jolanta B Pucilowska, MD      . nicotine (NICODERM CQ - dosed in mg/24 hours) patch 21 mg  21 mg Transdermal Daily Darliss Ridgel, MD   21 mg at 01/10/16 0829  . venlafaxine XR (EFFEXOR-XR) 24 hr capsule 150 mg  150 mg Oral Q breakfast Darliss Ridgel, MD   150 mg at 01/10/16 4098   Current Outpatient Prescriptions  Medication Sig Dispense Refill  . cephALEXin (KEFLEX) 500 MG capsule Take 1 capsule (500 mg total) by mouth 2 (two) times daily. 14 capsule 0  . haloperidol (HALDOL) 5 MG tablet Take 1 tablet (5 mg  total) by mouth at bedtime. 30 tablet 1  . [START ON 01/11/2016] lamoTRIgine (LAMICTAL) 200 MG tablet Take 1 tablet (200 mg total) by mouth at bedtime. 30 tablet 1  . naloxone HCl 4 MG/0.1ML LIQD Use as directed in case of opiate overdose. 1 each 1  . [START ON 01/11/2016] venlafaxine XR (EFFEXOR-XR) 150 MG 24 hr capsule Take 1 capsule (150 mg total) by mouth daily with breakfast. 30 capsule 1   PTA Medications: No prescriptions prior to admission.    Patient Stressors:  Loss of Job, Chronic Pain, Substance Use  Patient Strengths:  Family Support, Housing, Some Insight  Treatment Modalities: Medication Management, Group therapy, Case management,  1 to 1 session with clinician, Psychoeducation, Recreational therapy.   Physician Treatment Plan for Primary Diagnosis: Bipolar I disorder, most recent episode depressed (HCC) Long Term Goal(s): Ability to identify changes in lifestyle to reduce recurrance of condition will improve. Ability to verbalize feelings will improve. Ability to disclose and discuss suicidal ideas will improve, ability to demonstrate self-control will improve. Ability to maintain clinical measurements within normal limits will improve. Compliance with prescribed medications will improve.  Short Term Goals:   Ability to identify changes in lifestyle to reduce recurrance of condition will improve. Ability to verbalize feelings will improve. Ability to disclose and discuss suicidal ideas will improve, ability to demonstrate self-control will  improve. Ability to maintain clinical measurements within normal limits will improve. Compliance with prescribed medications will improve.  Medication Management: Evaluate patient's response, side effects, and tolerance of medication regimen.  Therapeutic Interventions: 1 to 1 sessions, Unit Group sessions and Medication administration.  Evaluation of Outcomes: Adequate for Discharge  Physician Treatment Plan for Secondary Diagnosis:  Principal Problem:   Bipolar I disorder, most recent episode depressed (HCC) Active Problems:   Back pain   Hydradenitis   Cannabis use disorder, moderate, dependence (HCC)   Opioid use disorder, moderate, dependence (HCC)   Tobacco use disorder   Chronic post-traumatic stress disorder (PTSD)  Long Term Goal(s):     Short Term Goals:       Medication Management: Evaluate patient's response, side effects, and tolerance of medication regimen.  Therapeutic Interventions: 1 to 1 sessions, Unit Group sessions and Medication administration.  Evaluation of Outcomes: Adequate for Discharge   RN Treatment Plan for Primary Diagnosis: Bipolar I disorder, most recent episode depressed (HCC) Long Term Goal(s): Knowledge of disease and therapeutic regimen to maintain health will improve  Short Term Goals: Ability to demonstrate self-control, Ability to participate in decision making will improve, Ability to verbalize feelings will improve, Ability to identify and develop effective coping behaviors will improve and Compliance with prescribed medications will improve  Medication Management: RN will administer medications as ordered by provider, will assess and evaluate patient's response and provide education to patient for prescribed medication. RN will report any adverse and/or side effects to prescribing provider.  Therapeutic Interventions: 1 on 1 counseling sessions, Psychoeducation, Medication administration, Evaluate responses to treatment, Monitor vital signs and CBGs as ordered, Perform/monitor CIWA, COWS, AIMS and Fall Risk screenings as ordered, Perform wound care treatments as ordered.  Evaluation of Outcomes: Adequate for Discharge   LCSW Treatment Plan for Primary Diagnosis: Bipolar I disorder, most recent episode depressed (HCC) Long Term Goal(s): Safe transition to appropriate next level of care at discharge, Engage patient in therapeutic group addressing interpersonal  concerns.  Short Term Goals: Engage patient in aftercare planning with referrals and resources, Increase emotional regulation, Facilitate acceptance of mental health diagnosis and concerns, Identify triggers associated with mental health/substance abuse issues and Increase skills for wellness and recovery  Therapeutic Interventions: Assess for all discharge needs, 1 to 1 time with Social worker, Explore available resources and support systems, Assess for adequacy in community support network, Educate family and significant other(s) on suicide prevention, Complete Psychosocial Assessment, Interpersonal group therapy.  Evaluation of Outcomes: Adequate for Discharge   Progress in Treatment: Attending groups: Yes. Participating in groups: Yes. Taking medication as prescribed: Yes. Toleration medication: Yes. Family/Significant other contact made: Yes, individual(s) contacted:  Delorise JacksonJennifer Land, sister Patient understands diagnosis: Yes. Discussing patient identified problems/goals with staff: Yes. Medical problems stabilized or resolved: Yes. Denies suicidal/homicidal ideation: Yes. Issues/concerns per patient self-inventory: No. Other:    New problem(s) identified: No, Describe:     New Short Term/Long Term Goal(s):  Discharge Plan or Barriers: Discharge home and [place] [facility] [place].  Pt provided information on Pain clinic as well as Suboxone resources as he remains unsure of which route will be available to him.    Reason for Continuation of Hospitalization: None  Estimated Length of Stay:0 days  Attendees: Patient:Kellar Clinton SawyerWilliamson 01/10/2016 5:05 PM  Physician: Kristine LineaJolanta Pucilowska, MD 01/10/2016 5:05 PM  Nursing: Hulan AmatoGwen Farrish, RN 01/10/2016 5:05 PM  RN Care Manager:Corrie Aurther Lofterry, RN 01/10/2016 5:05 PM  Social Worker: Jake SharkSara Kensli Bowley, LCSW 01/10/2016  5:05 PM  Recreational Therapist: Hershal Coria, LRT 01/10/2016 5:05 PM  Other:  01/10/2016 5:05 PM  Other:  01/10/2016 5:05  PM  Other: 01/10/2016 5:05 PM    Scribe for Treatment Team: Glennon Mac, LCSW 01/10/2016 5:05 PM

## 2016-01-10 NOTE — [facility] Group Notes (Signed)
[facility] Group Notes:  (Nursing/MHT/Case Management/Adjunct)  Date:  01/10/2016  Time:  1:29 AM  Type of Therapy:  Group Therapy  Participation Level:  Active  Participation Quality:  Appropriate  Affect:  Appropriate  Cognitive:  Appropriate  Insight:  Appropriate  Engagement in Group:  Engaged  Modes of Intervention:  n/a  Summary of Progress/Problems:  Ricky Campbell 01/10/2016, 1:29 AM

## 2016-01-10 NOTE — Progress Notes (Signed)
Recreation Therapy Notes  Date: 09.26.17 Time: 9:30 am Location: Craft Room  Group Topic: Self-expression  Goal Area(s) Addresses:  Patient will be able to identify a color that represents each emotion. Patient will verbalize benefit of using art as a means of self-expression. Patient will verbalize one emotion experienced while participating in activity.  Behavioral Response: Did not attend  Intervention: The Colors Within Me  Activity: Patients were given a blank face worksheet and were instructed to pick a color for each emotion they were feeling and show on the worksheet how much of that emotion they were feeling.  Education: LRT educated patients on different forms of self-expression.  Education Outcome: Patient did not attend group.  Clinical Observations/Feedback: Patient did not attend group.  Kimmie Berggren M, LRT/CTRS 01/10/2016 10:09 AM 
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
## 2017-06-29 ENCOUNTER — Other Ambulatory Visit: Payer: Self-pay

## 2017-06-29 ENCOUNTER — Emergency Department (HOSPITAL_COMMUNITY)
Admission: EM | Admit: 2017-06-29 | Discharge: 2017-06-30 | Disposition: A | Discharge status: Other Acute Inpatient Hospital | Payer: Medicare PPO | Attending: Physician Assistant | Admitting: Physician Assistant

## 2017-06-29 ENCOUNTER — Encounter (HOSPITAL_COMMUNITY): Payer: Self-pay | Admitting: *Deleted

## 2017-06-29 DIAGNOSIS — F209 Schizophrenia, unspecified: Secondary | ICD-10-CM | POA: Insufficient documentation

## 2017-06-29 DIAGNOSIS — Z79899 Other long term (current) drug therapy: Secondary | ICD-10-CM | POA: Diagnosis not present

## 2017-06-29 DIAGNOSIS — Z046 Encounter for general psychiatric examination, requested by authority: Secondary | ICD-10-CM | POA: Diagnosis present

## 2017-06-29 DIAGNOSIS — F329 Major depressive disorder, single episode, unspecified: Secondary | ICD-10-CM

## 2017-06-29 DIAGNOSIS — F17210 Nicotine dependence, cigarettes, uncomplicated: Secondary | ICD-10-CM | POA: Insufficient documentation

## 2017-06-29 DIAGNOSIS — F32A Depression, unspecified: Secondary | ICD-10-CM

## 2017-06-29 DIAGNOSIS — F319 Bipolar disorder, unspecified: Secondary | ICD-10-CM | POA: Diagnosis not present

## 2017-06-29 LAB — COMPREHENSIVE METABOLIC PANEL
ALT: 12 U/L — ABNORMAL LOW (ref 17–63)
ANION GAP: 11 (ref 5–15)
AST: 17 U/L (ref 15–41)
Albumin: 3.6 g/dL (ref 3.5–5.0)
Alkaline Phosphatase: 56 U/L (ref 38–126)
BUN: 10 mg/dL (ref 6–20)
CHLORIDE: 101 mmol/L (ref 101–111)
CO2: 27 mmol/L (ref 22–32)
Calcium: 9 mg/dL (ref 8.9–10.3)
Creatinine, Ser: 0.99 mg/dL (ref 0.61–1.24)
GFR calc Af Amer: >60 mL/min (ref >60)
GFR calc non Af Amer: >60 mL/min (ref >60)
GLUCOSE: 95 mg/dL (ref 65–99)
POTASSIUM: 3.4 mmol/L — AB (ref 3.5–5.1)
SODIUM: 139 mmol/L (ref 135–145)
TOTAL PROTEIN: 6.1 g/dL — AB (ref 6.5–8.1)
Total Bilirubin: 0.3 mg/dL (ref 0.3–1.2)

## 2017-06-29 LAB — CBC
HEMATOCRIT: 39.8 % (ref 39.0–52.0)
HEMOGLOBIN: 13 g/dL (ref 13.0–17.0)
MCH: 30.4 pg (ref 26.0–34.0)
MCHC: 32.7 g/dL (ref 30.0–36.0)
MCV: 93 fL (ref 78.0–100.0)
Platelets: 286 10*3/uL (ref 150–400)
RBC: 4.28 MIL/uL (ref 4.22–5.81)
RDW: 13.9 % (ref 11.5–15.5)
WBC: 10.1 10*3/uL (ref 4.0–10.5)

## 2017-06-29 LAB — RAPID URINE DRUG SCREEN, HOSP PERFORMED
AMPHETAMINES: NOT DETECTED
BARBITURATES: NOT DETECTED
Benzodiazepines: POSITIVE — AB
Cocaine: POSITIVE — AB
Opiates: POSITIVE — AB
TETRAHYDROCANNABINOL: POSITIVE — AB

## 2017-06-29 LAB — ETHANOL: Alcohol, Ethyl (B): <10 mg/dL (ref <10)

## 2017-06-29 MED ORDER — ONDANSETRON 4 MG PO TBDP: 4.0000 mg | ORAL_TABLET | Freq: Four times a day (QID) | ORAL | Status: DC | PRN

## 2017-06-29 MED ORDER — CLONIDINE HCL 0.2 MG PO TABS: 0.1000 mg | ORAL_TABLET | ORAL | Status: DC

## 2017-06-29 MED ORDER — METHOCARBAMOL 500 MG PO TABS
500.0000 mg | ORAL_TABLET | Freq: Three times a day (TID) | ORAL | Status: DC | PRN
  Administered 2017-06-29 – 2017-06-30 (×2): 500 mg via ORAL
  Filled 2017-06-29 (×2): qty 1

## 2017-06-29 MED ORDER — HYDROXYZINE HCL 25 MG PO TABS
25.0000 mg | ORAL_TABLET | Freq: Four times a day (QID) | ORAL | Status: DC | PRN
  Administered 2017-06-29 – 2017-06-30 (×2): 25 mg via ORAL
  Filled 2017-06-29 (×2): qty 1

## 2017-06-29 MED ORDER — CLONIDINE HCL 0.2 MG PO TABS: 0.1000 mg | ORAL_TABLET | Freq: Every day | ORAL | Status: DC

## 2017-06-29 MED ORDER — DICYCLOMINE HCL 20 MG PO TABS: 20.0000 mg | ORAL_TABLET | Freq: Four times a day (QID) | ORAL | Status: DC | PRN

## 2017-06-29 MED ORDER — LOPERAMIDE HCL 2 MG PO CAPS: 2.0000 mg | ORAL_CAPSULE | ORAL | Status: DC | PRN

## 2017-06-29 MED ORDER — CLONIDINE HCL 0.2 MG PO TABS
0.1000 mg | ORAL_TABLET | Freq: Four times a day (QID) | ORAL | Status: DC
  Administered 2017-06-29 – 2017-06-30 (×3): 0.1 mg via ORAL
  Filled 2017-06-29 (×3): qty 1

## 2017-06-29 MED ORDER — NAPROXEN 250 MG PO TABS
500.0000 mg | ORAL_TABLET | Freq: Two times a day (BID) | ORAL | Status: DC | PRN
  Administered 2017-06-29 – 2017-06-30 (×2): 500 mg via ORAL
  Filled 2017-06-29 (×2): qty 2

## 2017-06-29 NOTE — ED Notes (Signed)
[facility] and advised [employer], [employer], is out-of-network for them so unable to accept pt.
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
## 2017-06-29 NOTE — ED Notes (Signed)
TTS being done at bedside 

## 2017-06-29 NOTE — [facility] Counselor (Signed)
Notified Dr. Corlis Leak of plan to find inpatient placement for patient.

## 2017-06-29 NOTE — BH Assessment (Addendum)
Disregard revision: wrong patient

## 2017-06-29 NOTE — ED Notes (Signed)
[facility]ulatory - wearing burgundy scrubs.

## 2017-06-29 NOTE — ED Triage Notes (Signed)
The pt is c/o needing medicines  He is hearing voices and he has not had  Medicines for 1-2 years.  He has been using heroin snorting it last usage was this am.  [facility] because he did not think he got teatment he needed

## 2017-06-29 NOTE — ED Provider Notes (Signed)
[facility] EMERGENCY DEPARTMENT Provider Note   CSN: [id number] Arrival date & time: 06/29/17  0257     History   Chief Complaint Chief Complaint  Patient presents with  . Psychiatric Evaluation    HPI TANNAR BROKER is a 42 y.o. male.  HPI   42 year old male with history of schizophrenia and bipolar disorder presenting today asking for medication for withdrawal.  Patient states "I am in delirium tremens right now".  Patient is  somnolent on exam.  Falling asleep mid conversation.  He reports that this is because he took some Xanax he found on the street in the waiting room.  He said otherwise "I would be off the wall".  When asked about suicidal ideation patient states "I always feel like that, but I never act on it".  Patient has no plan. Patient reports a desire for inpatient treatment we discussed that we do not have inpatient treatment options in our hospital system.  Will consult TTS to advise on his passive suicidality and to help patient with outpatient resources.  Past Medical History:  Diagnosis Date  . Bipolar disorder (HCC)   . Chronic neck and back pain   . Chronic post-traumatic stress disorder (PTSD) 01/07/2016  . Hidradenitis   . Schizophrenia [facility])     Patient Active Problem List   Diagnosis Date Noted  . Bipolar I disorder, most recent episode depressed (HCC) 01/07/2016  . Back pain 01/07/2016  . Hydradenitis 01/07/2016  . Cannabis use disorder, moderate, dependence (HCC) 01/07/2016  . Opioid use disorder, moderate, dependence (HCC) 01/07/2016  . Tobacco use disorder 01/07/2016  . Chronic post-traumatic stress disorder (PTSD) 01/07/2016    Past Surgical History:  Procedure Laterality Date  . HAND SURGERY         Home Medications    Prior to Admission medications   Medication Sig Start Date End Date Taking? Authorizing Provider  cephALEXin (KEFLEX) 500 MG capsule Take 1 capsule (500 mg total) by mouth 2 (two) times  daily. 01/10/16   Pucilowska, Jolanta B, MD  haloperidol (HALDOL) 5 MG tablet Take 1 tablet (5 mg total) by mouth at bedtime. 01/10/16   Pucilowska, Braulio Conte B, MD  lamoTRIgine (LAMICTAL) 200 MG tablet Take 1 tablet (200 mg total) by mouth at bedtime. 01/11/16   Pucilowska, Jolanta B, MD  naloxone HCl 4 MG/0.1ML LIQD Use as directed in case of opiate overdose. 01/10/16   Pucilowska, Braulio Conte B, MD  venlafaxine XR (EFFEXOR-XR) 150 MG 24 hr capsule Take 1 capsule (150 mg total) by mouth daily with breakfast. 01/11/16   Pucilowska, Ellin Goodie, MD    Family History No family history on file.  Social History Social History   Tobacco Use  . Smoking status: Current Every Day Smoker  . Smokeless tobacco: Never Used  Substance Use Topics  . Alcohol use: Yes  . Drug use: Not on file     Allergies   Tetracyclines & related   Review of Systems Review of Systems  Constitutional: Negative for activity change.  Respiratory: Negative for shortness of breath.   Cardiovascular: Negative for chest pain.  Gastrointestinal: Negative for abdominal pain.  Psychiatric/Behavioral: Positive for sleep disturbance and suicidal ideas. The patient is not nervous/anxious.      Physical Exam Updated Vital Signs BP 125/68   Pulse 81   Temp 98.1 F (36.7 C)   Resp 18   Ht 5\' 6"  (1.676 m)   Wt 81.6 kg (180 lb)   SpO2 100%  BMI 29.05 kg/m   Physical Exam  Constitutional: He is oriented to person, place, and time. He appears well-nourished.  HENT:  Head: Normocephalic.  Eyes: Conjunctivae are normal.  Cardiovascular: Normal rate.  Pulmonary/Chest: Effort normal.  Neurological: He is oriented to person, place, and time.  Skin: Skin is warm and dry. He is not diaphoretic.  Psychiatric: He has a normal mood and affect. His behavior is normal.     ED Treatments / Results  Labs (all labs ordered are listed, but only abnormal results are displayed) Labs Reviewed  COMPREHENSIVE METABOLIC PANEL -  Abnormal; Notable for the following components:      Result Value   Potassium 3.4 (*)    Total Protein 6.1 (*)    ALT 12 (*)    All other components within normal limits  RAPID URINE DRUG SCREEN, HOSP PERFORMED - Abnormal; Notable for the following components:   Opiates POSITIVE (*)    Cocaine POSITIVE (*)    Benzodiazepines POSITIVE (*)    Tetrahydrocannabinol POSITIVE (*)    All other components within normal limits  ETHANOL  CBC    EKG  EKG Interpretation None       Radiology No results found.  Procedures Procedures (including critical care time)  Medications Ordered in ED Medications - No data to display   Initial Impression / Assessment and Plan / ED Course  I have reviewed the triage vital signs and the nursing notes.  Pertinent labs & imaging results that were available during my care of the patient were reviewed by me and considered in my medical decision making (see chart for details).      42 year old male with history of schizophrenia and bipolar disorder presenting today asking for medication for withdrawal.  Patient states "I am in delirium tremens right now".  Patient is  somnolent on exam.  Falling asleep mid conversation.  He reports that this is because he took some Xanax he found on the street in the waiting room.  He said otherwise "I would be off the wall".  When asked about suicidal ideation patient states "I always feel like that, but I never act on it".  Patient has no plan. Patient reports a desire for inpatient treatment we discussed that we do not have inpatient treatment options in our hospital system.  Will consult TTS to advise on his passive suicidality and to help patient with outpatient resources.  Final Clinical Impressions(s) / ED Diagnoses   Final diagnoses:  None    ED Discharge Orders    None       Abelino DerrickMackuen, Courteney Lyn, MD 06/29/17 [phone]

## 2017-06-29 NOTE — ED Notes (Signed)
Pt woke up and complained  about being wet from sweating so I told my nurse she got him a pair of dry pants he refuse to put them on RN is aware.

## 2017-06-29 NOTE — Progress Notes (Signed)
Patient meets criteria for inpatient treatment. [facility]. CSW faxed referrals to the following facilities for review. [facility], [facility], [facility], [facility], [facility], [facility], [facility], [facility], [facility], and [facility].   TTS will continue to seek bed placement.   Moss McKy-sha Dabney Dever, MSW, LCSW-A, LCAS-A 06/29/2017 12:41 PM

## 2017-06-29 NOTE — ED Notes (Signed)
Pt came to the desk he wants to be seen explanation given about the ed being full.  He has a friend whom he came over with and he wants to go back and see him

## 2017-06-29 NOTE — ED Notes (Signed)
Pt called RN to room - stating he needs to pay his light bill. States he has $ on his card that is in his wallet. Advised pt RN will attempt to retrieve from Security. Voiced understanding. States "I don't even remember where I parked my car last night. Will it get towed?" Advised pt it should not if it is in a parking space. Pt states "I want to go through treatment because I don't want to die from withdrawing from this shit". Pt requesting med for detox.

## 2017-06-29 NOTE — ED Notes (Signed)
Per Scherrie MerrittsAllison, [facility], pt accepted - may arrive tomorrow (06/30/17). Dr Loyola Mastornwall accepting - call report to [phone].

## 2017-06-29 NOTE — ED Notes (Signed)
[facility] is [facility] on Wait[facility].

## 2017-06-29 NOTE — ED Notes (Signed)
Staffing Office aware of need for Sitter. 

## 2017-06-29 NOTE — ED Notes (Signed)
Pt's wallet retrieved from his pants as requested. Woke pt and advised - wallet is located at nurses' desk for him to be able to call his utility company and make payment - as he had requested.

## 2017-06-29 NOTE — ED Notes (Signed)
Pt woke up - declined snack offered. Pt noted w/dry cough and will not cover his mouth as requested multiple times. Pt states he is confused as to why he has a Comptrolleritter. Advised pt d/t he advised he is SI - states "I only told them I was suicidal  . . ." and would not complete sentence. Pt states he has cysts on his groin area and is requesting to apply his "Baby Powder". Advised unable to use his personal products and that RN will notify EDP. Dr Juleen ChinaKohut aware. Pt stated he wants inpt tx for detox. States [facility] and "they would not help me". Pt then states, "I don't know what's going on. I've got bills to pay". Asked pt if he wants inpt tx or outpt. States he wants inpt. Pt given sheet and blanket removed as requested d/t states he is hot. Temperature to room has been turned down to 70 degrees. Pt declined snack offered. Dinner order request received. Pt then laid back down on bed and closed his eyes.

## 2017-06-29 NOTE — ED Notes (Signed)
Pt woke up again complain about being wet from sweating again. I told him their is some dry pants on his bed he still refuse to put them on also I offer to change out  Sheet& blanket on his bed for some dry ones he said it okay never mind I will just deal with it. RN is aware.

## 2017-06-29 NOTE — ED Notes (Signed)
Woke pt so may eat dinner.

## 2017-06-30 NOTE — ED Notes (Signed)
Shower supplies given as requested. 

## 2017-06-30 NOTE — ED Notes (Signed)
Pt aware and voices agreement w/tx plan - [facility]. States he does not have anyone to call to notify of acceptance.

## 2017-06-30 NOTE — ED Notes (Signed)
Per Carin Hockaniel, [facility], pt may arrive at 1000 this am.

## 2018-06-27 ENCOUNTER — Emergency Department
Admission: EM | Admit: 2018-06-27 | Discharge: 2018-06-28 | Disposition: A | Discharge status: Other Acute Inpatient Hospital | Payer: Medicare HMO | Attending: Emergency Medicine | Admitting: Emergency Medicine

## 2018-06-27 ENCOUNTER — Other Ambulatory Visit: Payer: Self-pay

## 2018-06-27 ENCOUNTER — Emergency Department: Payer: Medicare HMO

## 2018-06-27 DIAGNOSIS — R441 Visual hallucinations: Secondary | ICD-10-CM | POA: Insufficient documentation

## 2018-06-27 DIAGNOSIS — R45851 Suicidal ideations: Secondary | ICD-10-CM | POA: Insufficient documentation

## 2018-06-27 DIAGNOSIS — Z79899 Other long term (current) drug therapy: Secondary | ICD-10-CM | POA: Insufficient documentation

## 2018-06-27 DIAGNOSIS — F319 Bipolar disorder, unspecified: Secondary | ICD-10-CM | POA: Diagnosis not present

## 2018-06-27 DIAGNOSIS — R0789 Other chest pain: Secondary | ICD-10-CM | POA: Insufficient documentation

## 2018-06-27 DIAGNOSIS — F209 Schizophrenia, unspecified: Secondary | ICD-10-CM | POA: Insufficient documentation

## 2018-06-27 DIAGNOSIS — R440 Auditory hallucinations: Secondary | ICD-10-CM | POA: Insufficient documentation

## 2018-06-27 DIAGNOSIS — F17200 Nicotine dependence, unspecified, uncomplicated: Secondary | ICD-10-CM | POA: Insufficient documentation

## 2018-06-27 DIAGNOSIS — W19XXXA Unspecified fall, initial encounter: Secondary | ICD-10-CM | POA: Insufficient documentation

## 2018-06-27 DIAGNOSIS — R443 Hallucinations, unspecified: Secondary | ICD-10-CM

## 2018-06-27 LAB — COMPREHENSIVE METABOLIC PANEL
ALT: 48 U/L — ABNORMAL HIGH (ref 0–44)
ANION GAP: 11 (ref 5–15)
AST: 26 U/L (ref 15–41)
Albumin: 4.2 g/dL (ref 3.5–5.0)
Alkaline Phosphatase: 65 U/L (ref 38–126)
BILIRUBIN TOTAL: 0.5 mg/dL (ref 0.3–1.2)
BUN: 17 mg/dL (ref 6–20)
CHLORIDE: 108 mmol/L (ref 98–111)
CO2: 20 mmol/L — ABNORMAL LOW (ref 22–32)
Calcium: 8.5 mg/dL — ABNORMAL LOW (ref 8.9–10.3)
Creatinine, Ser: 0.89 mg/dL (ref 0.61–1.24)
GFR calc Af Amer: >60 mL/min (ref >60)
Glucose, Bld: 133 mg/dL — ABNORMAL HIGH (ref 70–99)
POTASSIUM: 3.3 mmol/L — AB (ref 3.5–5.1)
Sodium: 139 mmol/L (ref 135–145)
TOTAL PROTEIN: 6.7 g/dL (ref 6.5–8.1)

## 2018-06-27 LAB — CBC
HEMATOCRIT: 41.6 % (ref 39.0–52.0)
Hemoglobin: 13.4 g/dL (ref 13.0–17.0)
MCH: 29.9 pg (ref 26.0–34.0)
MCHC: 32.2 g/dL (ref 30.0–36.0)
MCV: 92.9 fL (ref 80.0–100.0)
PLATELETS: 287 10*3/uL (ref 150–400)
RBC: 4.48 MIL/uL (ref 4.22–5.81)
RDW: 13.4 % (ref 11.5–15.5)
WBC: 12.8 10*3/uL — AB (ref 4.0–10.5)
nRBC: 0 % (ref 0.0–0.2)

## 2018-06-27 LAB — URINE DRUG SCREEN, QUALITATIVE (ARMC ONLY)
Amphetamines, Ur Screen: NOT DETECTED
BARBITURATES, UR SCREEN: NOT DETECTED
Benzodiazepine, Ur Scrn: NOT DETECTED
Cannabinoid 50 Ng, Ur ~~LOC~~: NOT DETECTED
Cocaine Metabolite,Ur ~~LOC~~: NOT DETECTED
MDMA (Ecstasy)Ur Screen: NOT DETECTED
METHADONE SCREEN, URINE: NOT DETECTED
OPIATE, UR SCREEN: NOT DETECTED
Phencyclidine (PCP) Ur S: NOT DETECTED
TRICYCLIC, UR SCREEN: NOT DETECTED

## 2018-06-27 LAB — ETHANOL

## 2018-06-27 LAB — ACETAMINOPHEN LEVEL

## 2018-06-27 LAB — SALICYLATE LEVEL: Salicylate Lvl: <7 mg/dL (ref 2.8–30.0)

## 2018-06-27 NOTE — ED Notes (Signed)
Pt returned from xray

## 2018-06-27 NOTE — ED Notes (Signed)
Pt to xray via wheelchair

## 2018-06-27 NOTE — ED Notes (Addendum)
Patient changed into hospital attire and belonging's placed in labeled bag. Belonging's include:  Black book bag, Sleeveless shirt, shirt, jeans, belt, wallet, leather jacket, sneakers, socks. Patient has 193 dollars in cash in wallet. Counted by patient, Allayne Stack NT, and this RN.

## 2018-06-27 NOTE — ED Notes (Signed)
Called soc for consult

## 2018-06-27 NOTE — ED Provider Notes (Signed)
[facility] Emergency Department Provider Note   ____________________________________________   First MD Initiated Contact with Patient 06/27/18 2207     (approximate)  I have reviewed the triage vital signs and the nursing notes.   HISTORY  Chief Complaint Hallucinations    HPI Ricky Campbell is a 43 y.o. male who reports he is having much more vivid hallucinations than usual.  He is hearing voices telling him to hurt himself.  He says his reality testing seems to be breaking down little bit.  Additionally he says he fell about 2 weeks ago when he still having pain in the right side of his ribs.  Pain is worse with deep breathing coughing or with palpation.        Past Medical History:  Diagnosis Date  . Bipolar disorder (HCC)   . Chronic neck and back pain   . Chronic post-traumatic stress disorder (PTSD) 01/07/2016  . Hidradenitis   . Schizophrenia [facility])     Patient Active Problem List   Diagnosis Date Noted  . Bipolar I disorder, most recent episode depressed (HCC) 01/07/2016  . Back pain 01/07/2016  . Hydradenitis 01/07/2016  . Cannabis use disorder, moderate, dependence (HCC) 01/07/2016  . Opioid use disorder, moderate, dependence (HCC) 01/07/2016  . Tobacco use disorder 01/07/2016  . Chronic post-traumatic stress disorder (PTSD) 01/07/2016    Past Surgical History:  Procedure Laterality Date  . HAND SURGERY      Prior to Admission medications   Medication Sig Start Date End Date Taking? Authorizing Provider  cephALEXin (KEFLEX) 500 MG capsule Take 1 capsule (500 mg total) by mouth 2 (two) times daily. Patient not taking: Reported on 06/29/2017 01/10/16   Pucilowska, Braulio Conte B, MD  haloperidol (HALDOL) 5 MG tablet Take 1 tablet (5 mg total) by mouth at bedtime. Patient not taking: Reported on 06/29/2017 01/10/16   Pucilowska, Ellin Goodie, MD  lamoTRIgine (LAMICTAL) 200 MG tablet Take 1 tablet (200 mg total) by mouth at bedtime.  01/11/16   Pucilowska, Jolanta B, MD  naloxone HCl 4 MG/0.1ML LIQD Use as directed in case of opiate overdose. 01/10/16   Pucilowska, Braulio Conte B, MD  venlafaxine XR (EFFEXOR-XR) 150 MG 24 hr capsule Take 1 capsule (150 mg total) by mouth daily with breakfast. 01/11/16   Pucilowska, Jolanta B, MD    Allergies Tetracyclines & related  No family history on file.  Social History Social History   Tobacco Use  . Smoking status: Current Every Day Smoker    Packs/day: 1.00    Years: 15.00    Pack years: 15.00  . Smokeless tobacco: Never Used  Substance Use Topics  . Alcohol use: No    Frequency: Never  . Drug use: Yes    Frequency: 7.0 times per week    Types: Benzodiazepines, Heroin, Cocaine, Marijuana    Comment: uses daily, heroin; last use 2 weeks ago    Review of Systems  Constitutional: No fever/chills Eyes: No visual changes. ENT: No sore throat. Cardiovascular: Denies chest pain. Respiratory: Denies shortness of breath. Gastrointestinal: No abdominal pain.  No nausea, no vomiting.  No diarrhea.  No constipation. Genitourinary: Negative for dysuria. Musculoskeletal: Negative for back pain. Skin: Negative for rash. Neurological: Negative for headaches, focal weakness   ____________________________________________   PHYSICAL EXAM:  VITAL SIGNS: ED Triage Vitals  Enc Vitals Group     BP 06/27/18 2112 (!) 152/97     Pulse Rate 06/27/18 2112 (!) 106     Resp  06/27/18 2112 20     Temp 06/27/18 2112 100.2 F (37.9 C)     Temp Source 06/27/18 2112 Oral     SpO2 06/27/18 2112 98 %     Weight 06/27/18 2113 180 lb (81.6 kg)     Height 06/27/18 2113 5\' 11"  (1.803 m)     Head Circumference --      Peak Flow --      Pain Score 06/27/18 2112 6     Pain Loc --      Pain Edu? --      Excl. in GC? --     Constitutional: Alert and oriented. Well appearing and in no acute distress. Eyes: Conjunctivae are normal.  Head: Atraumatic. Nose: No congestion/rhinnorhea.  Mouth/Throat: Mucous membranes are moist.  Oropharynx non-erythematous. Neck: No stridor.  Cardiovascular: Normal rate, regular rhythm. Grossly normal heart sounds.  Good peripheral circulation. Respiratory: Normal respiratory effort.  No retractions. Lungs CTAB.  There is tenderness to palpation over the right lower ribs Gastrointestinal: Soft and nontender. No distention. No abdominal bruits. No CVA tenderness. Musculoskeletal: No lower extremity tenderness nor edema.  Neurologic:  Normal speech and language. No gross focal neurologic deficits are appreciated.  Skin:  Skin is warm, dry and intact. No rash noted.   ____________________________________________   LABS (all labs ordered are listed, but only abnormal results are displayed)  Labs Reviewed  COMPREHENSIVE METABOLIC PANEL - Abnormal; Notable for the following components:      Result Value   Potassium 3.3 (*)    CO2 20 (*)    Glucose, Bld 133 (*)    Calcium 8.5 (*)    ALT 48 (*)    All other components within normal limits  ACETAMINOPHEN LEVEL - Abnormal; Notable for the following components:   Acetaminophen (Tylenol), Serum <10 (*)    All other components within normal limits  CBC - Abnormal; Notable for the following components:   WBC 12.8 (*)    All other components within normal limits  ETHANOL  SALICYLATE LEVEL  URINE DRUG SCREEN, QUALITATIVE ([facility] ONLY)   ____________________________________________  EKG   ____________________________________________  RADIOLOGY  ED MD interpretation:   Official radiology report(s): No results found.  ____________________________________________   PROCEDURES  Procedure(s) performed (including Critical Care):  Procedures   ____________________________________________   INITIAL IMPRESSION / ASSESSMENT AND PLAN / ED COURSE               ____________________________________________   FINAL CLINICAL IMPRESSION(S) / ED DIAGNOSES  Final diagnoses:   Hallucinations     ED Discharge Orders    None       Note:  This document was prepared using Dragon voice recognition software and may include unintentional dictation errors.    Arnaldo Natal, MD 06/27/18 2218

## 2018-06-27 NOTE — ED Triage Notes (Signed)
Patient c/o auditory and visual hallucinations X 2 weeks. Patient reports voices are telling him to hurt himself, however, he has no desire to do so. Patient reports he's had hallucinations before, however, never this vivid. Patient reports that since he believes his medications have stopped working, he stopped taking his medications 2 days ago.

## 2018-06-27 NOTE — ED Notes (Signed)
Pt. Directed to B[facility] #5 from quad.  Pt. States "I have been self medicating for the past two years, because they took me off my regular medications".  Pt. States"I want to be back in my son's life but have not been able to get my head straight".  Pt. States "Feeling like someone/something is physically holding on to my leg and not allowing me to get out of bed when I am home"  Pt. States no motivation while at home, pt. States "I stay in bed most of the time".  Pt. Advised of rules in BHU, cameras and safety checks.  Pt. States he would come to nursing station for any questions or concerns.

## 2018-06-28 ENCOUNTER — Inpatient Hospital Stay
Admission: AD | Admit: 2018-06-28 | Discharge: 2018-07-07 | DRG: 885 | Disposition: A | Discharge status: Home / Self Care | Payer: Medicare HMO | Source: Intra-hospital | Attending: Psychiatry | Admitting: Psychiatry

## 2018-06-28 ENCOUNTER — Encounter: Payer: Self-pay | Admitting: Behavioral Health

## 2018-06-28 DIAGNOSIS — G4700 Insomnia, unspecified: Secondary | ICD-10-CM | POA: Diagnosis present

## 2018-06-28 DIAGNOSIS — R0781 Pleurodynia: Secondary | ICD-10-CM | POA: Diagnosis present

## 2018-06-28 DIAGNOSIS — F209 Schizophrenia, unspecified: Secondary | ICD-10-CM | POA: Diagnosis present

## 2018-06-28 DIAGNOSIS — F515 Nightmare disorder: Secondary | ICD-10-CM | POA: Diagnosis present

## 2018-06-28 DIAGNOSIS — F314 Bipolar disorder, current episode depressed, severe, without psychotic features: Secondary | ICD-10-CM | POA: Diagnosis present

## 2018-06-28 DIAGNOSIS — M549 Dorsalgia, unspecified: Secondary | ICD-10-CM | POA: Diagnosis present

## 2018-06-28 DIAGNOSIS — Z765 Malingerer [conscious simulation]: Secondary | ICD-10-CM

## 2018-06-28 DIAGNOSIS — Z89022 Acquired absence of left finger(s): Secondary | ICD-10-CM

## 2018-06-28 DIAGNOSIS — F332 Major depressive disorder, recurrent severe without psychotic features: Secondary | ICD-10-CM | POA: Diagnosis present

## 2018-06-28 DIAGNOSIS — F1211 Cannabis abuse, in remission: Secondary | ICD-10-CM | POA: Diagnosis not present

## 2018-06-28 DIAGNOSIS — Z915 Personal history of self-harm: Secondary | ICD-10-CM

## 2018-06-28 DIAGNOSIS — F17210 Nicotine dependence, cigarettes, uncomplicated: Secondary | ICD-10-CM | POA: Diagnosis present

## 2018-06-28 DIAGNOSIS — F1411 Cocaine abuse, in remission: Secondary | ICD-10-CM

## 2018-06-28 DIAGNOSIS — F4312 Post-traumatic stress disorder, chronic: Secondary | ICD-10-CM | POA: Diagnosis not present

## 2018-06-28 DIAGNOSIS — F1111 Opioid abuse, in remission: Secondary | ICD-10-CM | POA: Diagnosis not present

## 2018-06-28 DIAGNOSIS — Z560 Unemployment, unspecified: Secondary | ICD-10-CM | POA: Diagnosis not present

## 2018-06-28 DIAGNOSIS — F3130 Bipolar disorder, current episode depressed, mild or moderate severity, unspecified: Secondary | ICD-10-CM | POA: Diagnosis not present

## 2018-06-28 DIAGNOSIS — F17200 Nicotine dependence, unspecified, uncomplicated: Secondary | ICD-10-CM

## 2018-06-28 DIAGNOSIS — R0789 Other chest pain: Secondary | ICD-10-CM | POA: Diagnosis not present

## 2018-06-28 DIAGNOSIS — R45851 Suicidal ideations: Secondary | ICD-10-CM | POA: Diagnosis not present

## 2018-06-28 DIAGNOSIS — M542 Cervicalgia: Secondary | ICD-10-CM | POA: Diagnosis present

## 2018-06-28 DIAGNOSIS — G8929 Other chronic pain: Secondary | ICD-10-CM | POA: Diagnosis present

## 2018-06-28 DIAGNOSIS — F333 Major depressive disorder, recurrent, severe with psychotic symptoms: Secondary | ICD-10-CM | POA: Diagnosis not present

## 2018-06-28 LAB — TSH: TSH: 2.345 u[IU]/mL (ref 0.350–4.500)

## 2018-06-28 MED ORDER — VENLAFAXINE HCL ER 75 MG PO CP24: 150.0000 mg | ORAL_CAPSULE | Freq: Every day | ORAL | Status: DC

## 2018-06-28 MED ORDER — HALOPERIDOL 5 MG PO TABS: 5.0000 mg | ORAL_TABLET | Freq: Two times a day (BID) | ORAL | Status: DC | PRN

## 2018-06-28 MED ORDER — HALOPERIDOL 5 MG PO TABS
5.0000 mg | ORAL_TABLET | Freq: Two times a day (BID) | ORAL | Status: DC | PRN
  Administered 2018-06-28: 5 mg via ORAL
  Filled 2018-06-28: qty 1

## 2018-06-28 MED ORDER — VENLAFAXINE HCL 37.5 MG PO TABS
75.0000 mg | ORAL_TABLET | Freq: Every day | ORAL | Status: DC
  Administered 2018-06-29 – 2018-07-07 (×9): 75 mg via ORAL
  Filled 2018-06-28 (×9): qty 2

## 2018-06-28 MED ORDER — LAMOTRIGINE 100 MG PO TABS: 200.0000 mg | ORAL_TABLET | Freq: Every day | ORAL | Status: DC

## 2018-06-28 MED ORDER — QUETIAPINE FUMARATE 100 MG PO TABS
100.0000 mg | ORAL_TABLET | Freq: Every day | ORAL | Status: DC
  Administered 2018-06-28: 100 mg via ORAL
  Filled 2018-06-28: qty 1

## 2018-06-28 MED ORDER — ALUM & MAG HYDROXIDE-SIMETH 200-200-20 MG/5ML PO SUSP
15.0000 mL | ORAL | Status: DC | PRN
Start: 2018-06-28 — End: 2018-06-28

## 2018-06-28 MED ORDER — NICOTINE 14 MG/24HR TD PT24
14.0000 mg | MEDICATED_PATCH | Freq: Every day | TRANSDERMAL | Status: DC
  Administered 2018-06-28 – 2018-07-07 (×10): 14 mg via TRANSDERMAL
  Filled 2018-06-28 (×11): qty 1

## 2018-06-28 MED ORDER — TRAZODONE HCL 100 MG PO TABS
100.0000 mg | ORAL_TABLET | Freq: Every evening | ORAL | Status: DC | PRN
  Administered 2018-06-30 – 2018-07-06 (×7): 100 mg via ORAL
  Filled 2018-06-28 (×7): qty 1

## 2018-06-28 MED ORDER — MAGNESIUM HYDROXIDE 400 MG/5ML PO SUSP: 30.0000 mL | Freq: Every day | ORAL | Status: DC | PRN

## 2018-06-28 MED ORDER — ACETAMINOPHEN 325 MG PO TABS: 650.0000 mg | ORAL_TABLET | ORAL | Status: DC | PRN

## 2018-06-28 MED ORDER — HYDROXYZINE HCL 25 MG PO TABS
25.0000 mg | ORAL_TABLET | Freq: Four times a day (QID) | ORAL | Status: DC | PRN
  Administered 2018-06-28 (×2): 25 mg via ORAL
  Filled 2018-06-28 (×2): qty 1

## 2018-06-28 MED ORDER — VENLAFAXINE HCL ER 75 MG PO CP24
150.0000 mg | ORAL_CAPSULE | Freq: Every day | ORAL | Status: DC
  Administered 2018-06-28: 150 mg via ORAL
  Filled 2018-06-28: qty 2

## 2018-06-28 MED ORDER — ACETAMINOPHEN 325 MG PO TABS
650.0000 mg | ORAL_TABLET | ORAL | Status: DC | PRN
  Administered 2018-07-01 – 2018-07-03 (×3): 650 mg via ORAL
  Filled 2018-06-28 (×3): qty 2

## 2018-06-28 MED ORDER — GABAPENTIN 300 MG PO CAPS
300.0000 mg | ORAL_CAPSULE | Freq: Two times a day (BID) | ORAL | Status: DC
  Administered 2018-06-28 – 2018-07-07 (×18): 300 mg via ORAL
  Filled 2018-06-28 (×19): qty 1

## 2018-06-28 MED ORDER — ALUM & MAG HYDROXIDE-SIMETH 200-200-20 MG/5ML PO SUSP: 15.0000 mL | ORAL | Status: DC | PRN

## 2018-06-28 MED ORDER — MAGNESIUM HYDROXIDE 400 MG/5ML PO SUSP
30.0000 mL | Freq: Every day | ORAL | Status: DC | PRN
  Filled 2018-06-28: qty 30

## 2018-06-28 MED ORDER — HYDROXYZINE HCL 25 MG PO TABS
25.0000 mg | ORAL_TABLET | Freq: Four times a day (QID) | ORAL | Status: DC | PRN
  Administered 2018-06-29 – 2018-07-07 (×13): 25 mg via ORAL
  Filled 2018-06-28 (×13): qty 1
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
## 2018-06-28 NOTE — ED Provider Notes (Signed)
-----------------------------------------   1:57 AM on 06/28/2018 -----------------------------------------  Patient was evaluated by [facility] psychiatrist Dr. Hermelinda Medicus who recommends inpatient psychiatry admission. PRN medications ordered per her recommendations.   Irean Hong, MD 06/28/18 [phone]

## 2018-06-28 NOTE — ED Notes (Signed)
Pt. Finished SOC, SOC removed from room.

## 2018-06-28 NOTE — ED Notes (Signed)
SOC called report given, SOC machine set up in patients room, pt. Ready. 

## 2018-06-28 NOTE — Progress Notes (Signed)
Pt awake and oriented X4. Presents irritable with flat affect. Denies SI, HI, AVH and pain when assessed "not right now". Reports poor sleep from last night "I didn't sleep at all last night, can I get something for sleep, I need Ativan". EDP made aware, pt's home medication was restarted. All medications given per provider's order with verbal education and effects monitored. Pt encouraged to do his ADLs to to excessive body ordor but declined "I showered yesterday before I came here". Emotional support and encouragement provided to pt. Q 15 minutes safety checks continues without self harm gestures.
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
## 2018-06-28 NOTE — Plan of Care (Signed)
New admission,  43 year old male admitted to BMU for depression, anxiety and Suicidal Ideation. Patient believes a change in prescribed medications is no longer working for him. During time of admission patient denies SI, HI and AVH. Patient rates depression 8/10, and anxiety 10/10. Patient stressors include no family support even though patient has hopes of living with his father once discharged from hospital, and also not being able to raise son. Patient's son is now staying with patient's sister. Patient does smoke cigarettes, 1-2 packs daily and request nicotine patch. Patient admits drug use of cocaine and heroine.  During admission patient was polite and cooperative. Safety checks Q 15 minutes will continue. Problem: Education: Goal: Knowledge of [place] [facility] Education information/materials will improve Outcome: Not Progressing Goal: Emotional status will improve Outcome: Not Progressing Goal: Mental status will improve Outcome: Not Progressing Goal: Verbalization of understanding the information provided will improve Outcome: Not Progressing   Problem: Coping: Goal: Coping ability will improve Outcome: Not Progressing Goal: Will verbalize feelings Outcome: Not Progressing   Problem: Self-Concept: Goal: Ability to identify factors that promote anxiety will improve Outcome: Not Progressing Goal: Level of anxiety will decrease Outcome: Not Progressing Goal: Ability to modify response to factors that promote anxiety will improve Outcome: Not Progressing

## 2018-06-28 NOTE — ED Notes (Signed)
Pt. Up to use bathroom, pt. Returned to room with steady gait. 

## 2018-06-28 NOTE — BH Assessment (Signed)
Assessment Note  Ricky CadetChristopher J Campbell is an 43 y.o. male who presents to the ER due to AV/H. Patient reports, he was [facility] inpatient [facility]([place]) and while there they changed his medications. "Now I'm not good. I'm seeing and hearing things. I need to get back (mental state)." Patient also reports of having a history of substance use; heroin, cocaine and THC. He has had the AV/H, even when he's sober. Patient shared, he has had over fifty hospitalizations. "My first one was when I was with  During the interview, the patient was calm, cooperative and pleasant. He reported he was having an increase of anxiety. "Feel like I want to walk out my skin..." Prior to patient been seen by TTS, he was pacing in the BHU. With this Clinical research associatewriter, he denied SI/HI. He also denies history of violence and aggression.  Diagnosis: Bipolar  Past Medical History:  Past Medical History:  Diagnosis Date  . Bipolar disorder (HCC)   . Chronic neck and back pain   . Chronic post-traumatic stress disorder (PTSD) 01/07/2016  . Hidradenitis   . Schizophrenia [facility](HCC)     Past Surgical History:  Procedure Laterality Date  . HAND SURGERY      Family History: No family history on file.  Social History:  reports that he has been smoking. He has a 15.00 pack-year smoking history. He has never used smokeless tobacco. He reports current drug use. Frequency: 7.00 times per week. Drugs: Benzodiazepines, Heroin, Cocaine, and Marijuana. He reports that he does not drink alcohol.  Additional Social History:  Alcohol / Drug Use Pain Medications: See PTA Prescriptions: See PTA Over the Counter: See PTA History of alcohol / drug use?: Yes Longest period of sobriety (when/how long): "Patient states that he was clean for twenty years prior to his relapse on October 20189" (Per previous Assessment) Negative Consequences of Use: Financial, Personal relationships Substance #1 Name of Substance 1: Heroin 1 -  Age of First Use: 41 1 - Amount (size/oz): 1 gram 1 - Frequency: Daily 1 - Duration: "Full year" 1 - Last Use / Amount: "3 weeks ago" Substance #2 Name of Substance 2: Cannabis 2 - Age of First Use: 22 2 - Amount (size/oz): 1 gram 2 - Frequency: Daily 2 - Duration: "For about 20 years" 2 - Last Use / Amount: Monday Substance #3 Name of Substance 3: Xanax 3 - Age of First Use: 12 3 - Amount (size/oz): 6 mg  3 - Frequency: daily 3 - Last Use / Amount: 06/30/2018 Substance #4 Name of Substance 4: Cannabis 4 - Age of First Use: 22 4 - Amount (size/oz): 1 gram 4 - Frequency: daily 4 - Duration: "Since onset" ( 4 - Last Use / Amount: 07/02/2018  CIWA: CIWA-Ar BP: 112/78 Pulse Rate: 99 COWS:    Allergies:  Allergies  Allergen Reactions  . Tetracyclines & Related Swelling    Home Medications: (Not in a hospital admission)   OB/GYN Status:  No LMP for male patient.  General Assessment Data [facility] Assessment: In system Is this a Tele or Face-to-Face Assessment?: Face-to-Face Is this an Initial Assessment or a Re-assessment for this encounter?: Initial Assessment Patient Accompanied by:: N/A Language Other than English: No Living Arrangements: Other (Comment)(Private Home) What gender do you identify as?: Male Marital status: Single Pregnancy Status: No Living Arrangements: Alone Can pt return to current living arrangement?: Yes Admission Status: Voluntary Is patient capable of signing voluntary admission?: Yes Referral  Source: Self/Family/Friend Insurance type: [place] Medicare  Medical Screening Exam [facility] Walk-in ONLY) Medical Exam completed: Yes  Crisis Care Plan Living Arrangements: Alone Legal Guardian: Other:(Self) Name of Psychiatrist: Reports of none Name of Therapist: Reports of none  Education Status Is patient currently in school?: No Is the patient employed, unemployed or receiving disability?: Unemployed  Risk to  self with the past 6 months Suicidal Ideation: No Has patient been a risk to self within the past 6 months prior to admission? : No Suicidal Intent: No Has patient had any suicidal intent within the past 6 months prior to admission? : No Is patient at risk for suicide?: No Suicidal Plan?: No Has patient had any suicidal plan within the past 6 months prior to admission? : No Access to Means: No What has been your use of drugs/alcohol within the last 12 months?: Heroin, Cannabis and Cocaine Previous Attempts/Gestures: No How many times?: 5 Other Self Harm Risks: Reports of none Triggers for Past Attempts: Unknown Intentional Self Injurious Behavior: None Family Suicide History: Unknown Recent stressful life event(s): Loss (Comment), Financial Problems, Other (Comment) Persecutory voices/beliefs?: Yes Depression: Yes Depression Symptoms: Feeling angry/irritable, Feeling worthless/self pity, Isolating Substance abuse history and/or treatment for substance abuse?: Yes Suicide prevention information given to non-admitted patients: Not applicable  Risk to Others within the past 6 months Homicidal Ideation: No Does patient have any lifetime risk of violence toward others beyond the six months prior to admission? : No Thoughts of Harm to Others: No Current Homicidal Intent: No Current Homicidal Plan: No Access to Homicidal Means: No Identified Victim: Reports of none History of harm to others?: No Assessment of Violence: None Noted Violent Behavior Description: Reports of none Does patient have access to weapons?: No Criminal Charges Pending?: No Does patient have a court date: No Is patient on probation?: No  Psychosis Hallucinations: Auditory, Visual Delusions: None noted  Mental Status Report Appearance/Hygiene: Unremarkable, In scrubs Eye Contact: Good Motor Activity: Freedom of movement, Unremarkable Speech: Logical/coherent, Unremarkable Level of Consciousness:  Alert Mood: Depressed, Anxious, Sad, Helpless, Pleasant Affect: Appropriate to circumstance, Depressed, Anxious, Sad Anxiety Level: Minimal Thought Processes: Coherent, Relevant Judgement: Unimpaired Orientation: Person, Place, Time, Situation, Appropriate for developmental age Obsessive Compulsive Thoughts/Behaviors: Minimal  Cognitive Functioning Concentration: Normal Memory: Recent Intact, Remote Intact Is patient IDD: No Insight: Fair Impulse Control: Fair Appetite: Poor Have you had any weight changes? : Loss(Within the last year) Amount of the weight change? (lbs): 70 lbs Sleep: Decreased Total Hours of Sleep: 4 Vegetative Symptoms: None  ADLScreening [facility] Assessment Services) Patient's cognitive ability adequate to safely complete daily activities?: Yes Patient able to express need for assistance with ADLs?: Yes Independently performs ADLs?: Yes (appropriate for developmental age)  Prior Inpatient Therapy Prior Inpatient Therapy: Yes Prior Therapy Dates: "Multiple Hospitalizations" Prior Therapy Facilty/Provider(s): Multiple Hospitalizations Reason for Treatment: Mental Health & Substance Treatment  Prior Outpatient Therapy Prior Outpatient Therapy: No Does patient have an ACCT team?: No Does patient have Intensive In-House Services?  : No Does patient have [facility] services? : No Does patient have P4CC services?: No  ADL Screening (condition at time of admission) Patient's cognitive ability adequate to safely complete daily activities?: Yes Is the patient deaf or have difficulty hearing?: No Does the patient have difficulty seeing, even when wearing glasses/contacts?: No Does the patient have difficulty concentrating, remembering, or making decisions?: No Patient able to express need for assistance with ADLs?: Yes Independently performs ADLs?: Yes (appropriate for developmental age) Does the patient  have difficulty walking or climbing stairs?: No Weakness of  Legs: None Weakness of Arms/Hands: None  Home Assistive Devices/Equipment Home Assistive Devices/Equipment: None  Therapy Consults (therapy consults require a physician order) PT Evaluation Needed: No OT Evalulation Needed: No SLP Evaluation Needed: No Abuse/Neglect Assessment (Assessment to be complete while patient is alone) Abuse/Neglect Assessment Can Be Completed: Yes Physical Abuse: Denies Verbal Abuse: Denies Sexual Abuse: Denies Self-Neglect: Denies Values / Beliefs Cultural Requests During Hospitalization: None Spiritual Requests During Hospitalization: None Consults Spiritual Care Consult Needed: No Social Work Consult Needed: No Merchant navy officer (For Healthcare) Does Patient Have a Medical Advance Directive?: No Would patient like information on creating a medical advance directive?: No - Patient declined       Child/Adolescent Assessment Running Away Risk: Denies(Patient is an adult)  Disposition:  Disposition Initial Assessment Completed for this Encounter: Yes  On Site Evaluation by:   Reviewed with Physician:    Lilyan Gilford MS, LCAS, LPMHCA, NCC, CCSI Therapeutic Triage Specialist 06/28/2018 3:20 PM

## 2018-06-28 NOTE — Tx Team (Signed)
Initial Treatment Plan 06/28/2018 4:22 PM [name]    PATIENT STRESSORS: Financial difficulties Marital or family conflict Occupational concerns Substance abuse   PATIENT STRENGTHS: Ability for insight Motivation for treatment/growth Work skills   PATIENT IDENTIFIED PROBLEMS: Depression  Anxiety  Family Discord  Substance Abuse               DISCHARGE CRITERIA:  Ability to meet basic life and health needs Adequate post-discharge living arrangements Improved stabilization in mood, thinking, and/or behavior Motivation to continue treatment in a less acute level of care  PRELIMINARY DISCHARGE PLAN: Attend 12-step recovery group Outpatient therapy Return to previous living arrangement  PATIENT/FAMILY INVOLVEMENT: This treatment plan has been presented to and reviewed with the patient, Ricky Campbell, and/or family member.  The patient and family have been given the opportunity to ask questions and make suggestions.  Jim Desanctis Zamia Tyminski, RN 06/28/2018, 4:22 PM

## 2018-06-28 NOTE — BH Assessment (Signed)
Patient is to be admitted to [facility] by Dr. Zonia Kief.  Attending Physician will be Dr. Toni Amend.   Patient has been assigned to room 312, by [facility] Charge Nurse S[place].   Intake Paper Work has been signed and placed on patient chart.  ER staff is aware of the admission:  [name], ER Secretary    Dr. Lenard Lance, ER MD   Lincoln Maxin, Patient's Nurse   [place], Patient Access.

## 2018-06-29 LAB — HEMOGLOBIN A1C
HEMOGLOBIN A1C: 5.6 % (ref 4.8–5.6)
Mean Plasma Glucose: 114.02 mg/dL

## 2018-06-29 LAB — CBC
HCT: 46.2 % (ref 39.0–52.0)
Hemoglobin: 15.2 g/dL (ref 13.0–17.0)
MCH: 30 pg (ref 26.0–34.0)
MCHC: 32.9 g/dL (ref 30.0–36.0)
MCV: 91.3 fL (ref 80.0–100.0)
Platelets: 300 10*3/uL (ref 150–400)
RBC: 5.06 MIL/uL (ref 4.22–5.81)
RDW: 13.6 % (ref 11.5–15.5)
WBC: 12.7 10*3/uL — ABNORMAL HIGH (ref 4.0–10.5)
nRBC: 0 % (ref 0.0–0.2)

## 2018-06-29 LAB — COMPREHENSIVE METABOLIC PANEL
ALBUMIN: 4.3 g/dL (ref 3.5–5.0)
ALT: 43 U/L (ref 0–44)
AST: 23 U/L (ref 15–41)
Alkaline Phosphatase: 68 U/L (ref 38–126)
Anion gap: 13 (ref 5–15)
BUN: 17 mg/dL (ref 6–20)
CALCIUM: 9.1 mg/dL (ref 8.9–10.3)
CO2: 18 mmol/L — ABNORMAL LOW (ref 22–32)
Chloride: 106 mmol/L (ref 98–111)
Creatinine, Ser: 0.75 mg/dL (ref 0.61–1.24)
GFR calc Af Amer: >60 mL/min (ref >60)
GFR calc non Af Amer: >60 mL/min (ref >60)
Glucose, Bld: 137 mg/dL — ABNORMAL HIGH (ref 70–99)
Potassium: 3.9 mmol/L (ref 3.5–5.1)
SODIUM: 137 mmol/L (ref 135–145)
TOTAL PROTEIN: 7.1 g/dL (ref 6.5–8.1)
Total Bilirubin: 0.5 mg/dL (ref 0.3–1.2)

## 2018-06-29 LAB — LIPID PANEL
Cholesterol: 275 mg/dL — ABNORMAL HIGH (ref 0–200)
HDL: 64 mg/dL (ref >40)
LDL Cholesterol: 175 mg/dL — ABNORMAL HIGH (ref 0–99)
Total CHOL/HDL Ratio: 4.3 RATIO
Triglycerides: 178 mg/dL — ABNORMAL HIGH (ref <150)
VLDL: 36 mg/dL (ref 0–40)

## 2018-06-29 LAB — TSH: TSH: 2.046 u[IU]/mL (ref 0.350–4.500)

## 2018-06-29 MED ORDER — QUETIAPINE FUMARATE 25 MG PO TABS
150.0000 mg | ORAL_TABLET | Freq: Every day | ORAL | Status: DC
  Administered 2018-06-29: 150 mg via ORAL
  Filled 2018-06-29: qty 1
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
## 2018-06-29 NOTE — Progress Notes (Signed)
Patient is feeling depresses and isolative to his room, affect is some what moody but responding appropriately, medication is taken frequent without any side effects , sleep is continuous without any interruptions, patient voice no concerns, low energy level is noted , encourage patient to think positive about self and encourage participation in activities with peers noted, patient denies any SI/HI/AVH, and 15 minutes safety rounding is maintained.

## 2018-06-29 NOTE — Plan of Care (Signed)
D- Patient alert and oriented. Patient presents in a pleasant mood on assessment stating that he slept alright last night and the only complaint that he had is of right rib pain, in which he did not request any pain medication from this Clinical research associate. Patient endorsed depression and anxiety reporting that "just deep thinking" and "being here" is why he feels this way. Patient denies SI, HI, AVH, at this time stating to this writer "not since last night".  A- Scheduled medications administered to patient, per MD orders. Support and encouragement provided. Routine safety checks conducted every 15 minutes.  Patient informed to notify staff with problems or concerns.  R- No adverse drug reactions noted. Patient contracts for safety at this time. Patient compliant with medications and treatment plan. Patient receptive, calm, and cooperative. Patient interacts well with others on the unit.  Patient remains safe at this time.   Problem: Education: Goal: Knowledge of [place] [facility] Education information/materials will improve Outcome: Progressing Goal: Emotional status will improve Outcome: Progressing Goal: Mental status will improve Outcome: Progressing Goal: Verbalization of understanding the information provided will improve Outcome: Progressing   Problem: Coping: Goal: Coping ability will improve Outcome: Progressing Goal: Will verbalize feelings Outcome: Progressing   Problem: Self-Concept: Goal: Ability to identify factors that promote anxiety will improve Outcome: Progressing Goal: Level of anxiety will decrease Outcome: Progressing Goal: Ability to modify response to factors that promote anxiety will improve Outcome: Progressing

## 2018-06-29 NOTE — BHH Group Notes (Signed)
LCSW Group Therapy Note 06/29/2018 1:15pm  Type of Therapy and Topic: Group Therapy: Feelings Around Returning Home & Establishing a Supportive Framework and Supporting Oneself When Supports Not Available  Participation Level: Did Not Attend  Description of Group:  Patients first processed thoughts and feelings about upcoming discharge. These included fears of upcoming changes, lack of change, new living environments, judgements and expectations from others and overall stigma of mental health issues. The group then discussed the definition of a supportive framework, what that looks and feels like, and how do to discern it from an unhealthy non-supportive network. The group identified different types of supports as well as what to do when your family/friends are less than helpful or unavailable  Therapeutic Goals  1. Patient will identify one healthy supportive network that they can use at discharge. 2. Patient will identify one factor of a supportive framework and how to tell it from an unhealthy network. 3. Patient able to identify one coping skill to use when they do not have positive supports from others. 4. Patient will demonstrate ability to communicate their needs through discussion and/or role plays.  Summary of Patient Progress:  Pt was invited to attend group but chose not to attend. CSW will continue to encourage pt to attend group throughout their admission.    Therapeutic Modalities Cognitive Behavioral Therapy Motivational Interviewing   Estevon Fluke  CUEBAS-COLON, LCSW 06/29/2018 9:38 AM
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
## 2018-06-30 DIAGNOSIS — F333 Major depressive disorder, recurrent, severe with psychotic symptoms: Secondary | ICD-10-CM

## 2018-06-30 MED ORDER — QUETIAPINE FUMARATE 200 MG PO TABS
200.0000 mg | ORAL_TABLET | Freq: Every day | ORAL | Status: DC
  Administered 2018-06-30 – 2018-07-06 (×7): 200 mg via ORAL
  Filled 2018-06-30 (×7): qty 1

## 2018-06-30 NOTE — Progress Notes (Signed)
[facility] MD Progress Note  06/30/2018 6:25 PM Ricky Campbell  MRN:  [id number] Subjective: Follow-up for this gentleman with mood symptoms and substance abuse issues.  Patient seen chart reviewed.  Patient starts in immediately with asking me to provide Xanax and Adderall for him.  I have checked the controlled substance database and there is no evidence of any controlled substance prescriptions in the last 2 years.  Patient admits this is true but says he was getting the medicines off the street.  He continues to report being depressed irritable angry having suicidal thoughts and having hallucinations although he does not look like he is responding to internal stimuli.  He says he cannot think straight but he is certainly lucid and able to hold a clear conversation.  No specific new physical complaints.  He is wanting to go to some kind of inpatient treatment Principal Problem: Recurrent major depression-severe (HCC) Diagnosis: Principal Problem:   Recurrent major depression-severe (HCC) Active Problems:   Cannabis abuse, in remission   Cocaine abuse in remission (HCC)   Opioid abuse, in remission (HCC)  Total Time spent with patient: 30 minutes  Past Psychiatric History: Patient has a past history of hospitalizations under similar circumstances past history of substance abuse problems  Past Medical History:  Past Medical History:  Diagnosis Date  . Bipolar disorder (HCC)   . Chronic neck and back pain   . Chronic post-traumatic stress disorder (PTSD) 01/07/2016  . Hidradenitis   . Schizophrenia [facility])     Past Surgical History:  Procedure Laterality Date  . HAND SURGERY     Family History: History reviewed. No pertinent family history. Family Psychiatric  History: None Social History:  Social History   Substance and Sexual Activity  Alcohol Use No  . Frequency: Never     Social History   Substance and Sexual Activity  Drug Use Yes  . Frequency: 7.0 times per week  .  Types: Benzodiazepines, Heroin, Cocaine, Marijuana   Comment: uses daily, heroin; last use 2 weeks ago    Social History   Socioeconomic History  . Marital status: Legally Separated    Spouse name: Not on file  . Number of children: Not on file  . Years of education: Not on file  . Highest education level: Not on file  Occupational History  . Not on file  Social Needs  . Financial resource strain: Not on file  . Food insecurity:    Worry: Not on file    Inability: Not on file  . Transportation needs:    Medical: Not on file    Non-medical: Not on file  Tobacco Use  . Smoking status: Current Every Day Smoker    Packs/day: 1.00    Years: 15.00    Pack years: 15.00  . Smokeless tobacco: Never Used  Substance and Sexual Activity  . Alcohol use: No    Frequency: Never  . Drug use: Yes    Frequency: 7.0 times per week    Types: Benzodiazepines, Heroin, Cocaine, Marijuana    Comment: uses daily, heroin; last use 2 weeks ago  . Sexual activity: Not on file  Lifestyle  . Physical activity:    Days per week: Not on file    Minutes per session: Not on file  . Stress: Not on file  Relationships  . Social connections:    Talks on phone: Not on file    Gets together: Not on file    Attends religious service: Not on  file    Active member of club or organization: Not on file    Attends meetings of clubs or organizations: Not on file    Relationship status: Not on file  Other Topics Concern  . Not on file  Social History Narrative  . Not on file   Additional Social History:                         Sleep: Fair  Appetite:  Fair  Current Medications: Current Facility-Administered Medications  Medication Dose Route Frequency Provider Last Rate Last Dose  . acetaminophen (TYLENOL) tablet 650 mg  650 mg Oral Q4H PRN Terance Hart, MD      . alum & mag hydroxide-simeth (MAALOX/MYLANTA) 200-200-20 MG/5ML suspension 15 mL  15 mL Oral Q4H PRN Katheran Awe C,  MD      . gabapentin (NEURONTIN) capsule 300 mg  300 mg Oral BID Darliss Ridgel, MD   300 mg at 06/30/18 1701  . haloperidol (HALDOL) tablet 5 mg  5 mg Oral BID PRN Terance Hart, MD      . hydrOXYzine (ATARAX/VISTARIL) tablet 25 mg  25 mg Oral Q6H PRN Terance Hart, MD   25 mg at 06/29/18 4098  . magnesium hydroxide (MILK OF MAGNESIA) suspension 30 mL  30 mL Oral Daily PRN Terance Hart, MD      . nicotine (NICODERM CQ - dosed in mg/24 hours) patch 14 mg  14 mg Transdermal Daily Darliss Ridgel, MD   14 mg at 06/30/18 0754  . QUEtiapine (SEROQUEL) tablet 200 mg  200 mg Oral QHS Coben Godshall T, MD      . traZODone (DESYREL) tablet 100 mg  100 mg Oral QHS PRN Terance Hart, MD      . venlafaxine [facility]) tablet 75 mg  75 mg Oral QPC breakfast Darliss Ridgel, MD   75 mg at 06/30/18 0753    Lab Results:  Results for orders placed or performed during the hospital encounter of 06/28/18 (from the past 48 hour(s))  CBC     Status: Abnormal   Collection Time: 06/29/18  6:43 AM  Result Value Ref Range   WBC 12.7 (H) 4.0 - 10.5 K/uL   RBC 5.06 4.22 - 5.81 MIL/uL   Hemoglobin 15.2 13.0 - 17.0 g/dL   HCT 11.9 14.7 - 82.9 %   MCV 91.3 80.0 - 100.0 fL   MCH 30.0 26.0 - 34.0 pg   MCHC 32.9 30.0 - 36.0 g/dL   RDW 56.2 13.0 - 86.5 %   Platelets 300 150 - 400 K/uL   nRBC 0.0 0.0 - 0.2 %    Comment: Performed at [facility], [address] [place], [place] [address]  Comprehensive metabolic panel     Status: Abnormal   Collection Time: 06/29/18  6:43 AM  Result Value Ref Range   Sodium 137 135 - 145 mmol/L   Potassium 3.9 3.5 - 5.1 mmol/L   Chloride 106 98 - 111 mmol/L   CO2 18 (L) 22 - 32 mmol/L   Glucose, Bld 137 (H) 70 - 99 mg/dL   BUN 17 6 - 20 mg/dL   Creatinine, Ser 6.29 0.61 - 1.24 mg/dL   Calcium 9.1 8.9 - 52.8 mg/dL   Total Protein 7.1 6.5 - 8.1 g/dL   Albumin 4.3 3.5 - 5.0 g/dL   AST 23 15 - 41 U/L   ALT 43 0 - 44  U/L   Alkaline Phosphatase 68 38  - 126 U/L   Total Bilirubin 0.5 0.3 - 1.2 mg/dL   GFR calc non Af Amer >60 >60 mL/min   GFR calc Af Amer >60 >60 mL/min   Anion gap 13 5 - 15    Comment: Performed at [facility], [address] [place], [place] [address]  Hemoglobin A1c     Status: None   Collection Time: 06/29/18  6:43 AM  Result Value Ref Range   Hgb A1c MFr Bld 5.6 4.8 - 5.6 %    Comment: (NOTE) Pre diabetes:          5.7%-6.4% Diabetes:              >6.4% Glycemic control for   <7.0% adults with diabetes    Mean Plasma Glucose 114.02 mg/dL    Comment: Performed at [facility], [address] [place], [place] [address]  Lipid panel     Status: Abnormal   Collection Time: 06/29/18  6:43 AM  Result Value Ref Range   Cholesterol 275 (H) 0 - 200 mg/dL   Triglycerides 027 (H) <150 mg/dL   HDL 64 >74 mg/dL   Total CHOL/HDL Ratio 4.3 RATIO   VLDL 36 0 - 40 mg/dL   LDL Cholesterol 128 (H) 0 - 99 mg/dL    Comment:        Total Cholesterol/HDL:CHD Risk Coronary Heart Disease Risk Table                     Men   Women  1/2 Average Risk   3.4   3.3  Average Risk       5.0   4.4  2 X Average Risk   9.6   7.1  3 X Average Risk  23.4   11.0        Use the calculated Patient Ratio above and the CHD Risk Table to determine the patient's CHD Risk.        ATP III CLASSIFICATION (LDL):  <100     mg/dL   Optimal  786-767  mg/dL   Near or Above                    Optimal  130-159  mg/dL   Borderline  209-470  mg/dL   High  >962     mg/dL   Very High Performed at [facility], [address] [place], [place] [address]   TSH     Status: None   Collection Time: 06/29/18  6:43 AM  Result Value Ref Range   TSH 2.046 0.350 - 4.500 uIU/mL    Comment: Performed by a 3rd Generation assay with a functional sensitivity of <=0.01 uIU/mL. Performed at [facility], [address] [place], [place] [address]     Blood Alcohol level:  Lab Results  Component Value Date   [facility] <10  06/27/2018   ETH <10 06/29/2017    Metabolic Disorder Labs: Lab Results  Component Value Date   HGBA1C 5.6 06/29/2018   MPG 114.02 06/29/2018   MPG 111 01/08/2016   No results found for: PROLACTIN Lab Results  Component Value Date   CHOL 275 (H) 06/29/2018   TRIG 178 (H) 06/29/2018   HDL 64 06/29/2018   CHOLHDL 4.3 06/29/2018   VLDL 36 06/29/2018   LDLCALC 175 (H) 06/29/2018   LDLCALC 167 (H) 01/08/2016    Physical Findings: AIMS:  , ,  ,  ,  CIWA:    COWS:     Musculoskeletal: Strength & Muscle Tone: within normal limits Gait & Station: normal Patient leans: N/A  Psychiatric Specialty Exam: Physical Exam  Nursing note and vitals reviewed. Constitutional: He appears well-developed and well-nourished.  HENT:  Head: Normocephalic and atraumatic.  Eyes: Pupils are equal, round, and reactive to light. Conjunctivae are normal.  Neck: Normal range of motion.  Cardiovascular: Regular rhythm and normal heart sounds.  Respiratory: Effort normal. No respiratory distress.  GI: Soft.  Musculoskeletal: Normal range of motion.  Neurological: He is alert.  Skin: Skin is warm and dry.  Psychiatric: His affect is angry. His speech is tangential. He is agitated. He is not aggressive. Thought content is not paranoid. Cognition and memory are impaired. He expresses impulsivity. He expresses suicidal ideation. He expresses no homicidal ideation.    Review of Systems  Constitutional: Negative.   HENT: Negative.   Eyes: Negative.   Respiratory: Negative.   Cardiovascular: Negative.   Gastrointestinal: Negative.   Musculoskeletal: Negative.   Skin: Negative.   Neurological: Negative.   Psychiatric/Behavioral: Positive for depression, hallucinations, memory loss, substance abuse and suicidal ideas. The patient is nervous/anxious and has insomnia.     Blood pressure 120/88, pulse 87, temperature 98.1 F (36.7 C), temperature source Oral, resp. rate 18, height  (1.803 m),  weight 83.5 kg, SpO2 100 %.Body mass index is 25.66 kg/m.  General Appearance: Casual  Eye Contact:  Fair  Speech:  Clear and Coherent  Volume:  Normal  Mood:  Angry and Depressed  Affect:  Congruent  Thought Process:  Coherent  Orientation:  Full (Time, Place, and Person)  Thought Content:  Logical  Suicidal Thoughts:  Yes.  without intent/plan  Homicidal Thoughts:  No  Memory:  Immediate;   Fair Recent;   Fair Remote;   Fair  Judgement:  Impaired  Insight:  Shallow  Psychomotor Activity:  Normal  Concentration:  Concentration: Fair  Recall:  [employer] of Knowledge:  Fair  Language:  Fair  Akathisia:  No  Handed:  Right  AIMS (if indicated):     Assets:  Desire for Improvement Physical Health Resilience  ADL's:  Intact  Cognition:  WNL  Sleep:  Number of Hours: 7.75     Treatment Plan Summary: Daily contact with patient to assess and evaluate symptoms and progress in treatment, Medication management and Plan Patient begging for Xanax and Adderall.  It does appear from looking at the old records to be true that in the past he had been prescribed these medicines but given his heavy recent substance abuse not to mention his past history I do not see that there is a clear indication for these.  His current symptoms are not consistent with starting these medicines.  Seems to be having irritability and mood symptoms.  Agree with the Seroquel and Effexor.  Increased dose of Seroquel to 200.  Treatment team is working on referral to substance abuse facilities if possible.  Mordecai Rasmussen, MD 06/30/2018, 6:25 PM

## 2018-06-30 NOTE — Progress Notes (Signed)
Recreation Therapy Notes  INPATIENT RECREATION THERAPY ASSESSMENT  Patient Details Name: Ricky Campbell MRN: [id number] DOB: 27-Sep-1975 Today's Date: 06/30/2018       Information Obtained From: Patient  Able to Participate in Assessment/Interview: Yes  Patient Presentation: Responsive  Reason for Admission (Per Patient): Active Symptoms, Substance Abuse  Patient Stressors: Family  Coping Skills:   Substance Abuse  Leisure Interests (2+):  Social - Family, Sports - Energy manager)  Frequency of Recreation/Participation: Chief Executive Officer of Community Resources:     [employer]:     Current Use:    If no, Barriers?:    Expressed Interest in [employer] Information:    [place] of Residence:  Dispensing optician  Patient Main Form of Transportation: Set designer  Patient Strengths:  Automotive engineer, [place]  Patient Identified Areas of Improvement:  Dealing with situation better  Patient Goal for Hospitalization:  Stop hearing and seeing things and stop being scared qof being alone.  Current SI (including self-harm):  No  Current HI:  No  Current AVH: No  Staff Intervention Plan: Group Attendance, Collaborate with Interdisciplinary Treatment Team  Consent to Intern Participation: N/A  Glee Lashomb 06/30/2018, 4:24 PM

## 2018-06-30 NOTE — Plan of Care (Signed)
Patient is pacing in the hallway with a flat depressed affect.Patient rated his depression 6/10 and anxiety 7/10.Patient stated that he gets angry easily and he need to work on that.Patient appropriate in the unit.Compliant with medications.Appetite and energy level good.Support and encouragement given.

## 2018-06-30 NOTE — [facility] Suicide Risk Assessment (Signed)
[facility] INPATIENT:  Family/Significant Other Suicide Prevention Education  Suicide Prevention Education:  Contact Attempts: Buck Ilgenfritz, father, [phone] has been identified by the patient as the family member/significant other with whom the patient will be residing, and identified as the person(s) who will aid the patient in the event of a mental health crisis.  With written consent from the patient, two attempts were made to provide suicide prevention education, prior to and/or following the patient's discharge.  We were unsuccessful in providing suicide prevention education.  A suicide education pamphlet was given to the patient to share with family/significant other.  Date and time of first attempt: 06/30/2018 at 1:15pm Date and time of second attempt: Second attempt is needed  Harden Mo 06/30/2018, 1:14 PM

## 2018-06-30 NOTE — [facility] Group Notes (Signed)
LCSW Group Therapy Note   06/30/2018 12:39 PM   Type of Therapy and Topic:  Group Therapy:  Overcoming Obstacles   Participation Level:  Did Not Attend   Description of Group:    In this group patients will be encouraged to explore what they see as obstacles to their own wellness and recovery. They will be guided to discuss their thoughts, feelings, and behaviors related to these obstacles. The group will process together ways to cope with barriers, with attention given to specific choices patients can make. Each patient will be challenged to identify changes they are motivated to make in order to overcome their obstacles. This group will be process-oriented, with patients participating in exploration of their own experiences as well as giving and receiving support and challenge from other group members.   Therapeutic Goals: 1. Patient will identify personal and current obstacles as they relate to admission. 2. Patient will identify barriers that currently interfere with their wellness or overcoming obstacles.  3. Patient will identify feelings, thought process and behaviors related to these barriers. 4. Patient will identify two changes they are willing to make to overcome these obstacles:      Summary of Patient Progress x     Therapeutic Modalities:   Cognitive Behavioral Therapy Solution Focused Therapy Motivational Interviewing Relapse Prevention Therapy  Brenten Janney, MSW, LCSW Clinical Social Work 06/30/2018 12:39 PM   

## 2018-06-30 NOTE — Plan of Care (Signed)
   Pt pleasant, calm and cooperative.  Denies suicide ideation  and  any anxiety. Contract for safety.  Pt is observed interacting with peers in dayroom. Took all medication without issues.  Currently resting comfortably in bed with Q-15-minute safety check in progress.   Problem: Education: Goal: Emotional status will improve Outcome: Progressing Goal: Mental status will improve Outcome: Progressing Goal: Verbalization of understanding the information provided will improve Outcome: Progressing   Problem: Coping: Goal: Coping ability will improve Outcome: Progressing Goal: Will verbalize feelings Outcome: Progressing   Problem: Self-Concept: Goal: Ability to identify factors that promote anxiety will improve Outcome: Progressing Goal: Ability to modify response to factors that promote anxiety will improve Outcome: Progressing

## 2018-06-30 NOTE — Tx Team (Addendum)
Interdisciplinary Treatment and Diagnostic Plan Update  06/30/2018 Time of Session: 2:30PM Ricky Campbell MRN: [id number]  Principal Diagnosis: <principal problem not specified>  Secondary Diagnoses: Active Problems:   Recurrent major depression-severe (HCC)   Cannabis abuse, in remission   Cocaine abuse in remission (HCC)   Opioid abuse, in remission (HCC)   Current Medications:  Current Facility-Administered Medications  Medication Dose Route Frequency Provider Last Rate Last Dose  . acetaminophen (TYLENOL) tablet 650 mg  650 mg Oral Q4H PRN Terance Hart, MD      . alum & mag hydroxide-simeth (MAALOX/MYLANTA) 200-200-20 MG/5ML suspension 15 mL  15 mL Oral Q4H PRN Katheran Awe C, MD      . gabapentin (NEURONTIN) capsule 300 mg  300 mg Oral BID Darliss Ridgel, MD   300 mg at 06/30/18 0754  . haloperidol (HALDOL) tablet 5 mg  5 mg Oral BID PRN Terance Hart, MD      . hydrOXYzine (ATARAX/VISTARIL) tablet 25 mg  25 mg Oral Q6H PRN Terance Hart, MD   25 mg at 06/29/18 4098  . magnesium hydroxide (MILK OF MAGNESIA) suspension 30 mL  30 mL Oral Daily PRN Terance Hart, MD      . nicotine (NICODERM CQ - dosed in mg/24 hours) patch 14 mg  14 mg Transdermal Daily Darliss Ridgel, MD   14 mg at 06/30/18 0754  . QUEtiapine (SEROQUEL) tablet 200 mg  200 mg Oral QHS Clapacs, John T, MD      . traZODone (DESYREL) tablet 100 mg  100 mg Oral QHS PRN Terance Hart, MD      . venlafaxine [facility]) tablet 75 mg  75 mg Oral QPC breakfast Darliss Ridgel, MD   75 mg at 06/30/18 0753   PTA Medications: Medications Prior to Admission  Medication Sig Dispense Refill Last Dose  . haloperidol (HALDOL) 5 MG tablet Take 1 tablet (5 mg total) by mouth at bedtime. (Patient not taking: Reported on 06/29/2017) 30 tablet 1 Not Taking at Unknown time  . lamoTRIgine (LAMICTAL) 200 MG tablet Take 1 tablet (200 mg total) by mouth at bedtime. (Patient not taking: Reported on  06/27/2018) 30 tablet 1 Not Taking at Unknown time  . naloxone HCl 4 MG/0.1ML LIQD Use as directed in case of opiate overdose. (Patient not taking: Reported on 06/27/2018) 1 each 1 Not Taking at Unknown time  . venlafaxine XR (EFFEXOR-XR) 150 MG 24 hr capsule Take 1 capsule (150 mg total) by mouth daily with breakfast. (Patient not taking: Reported on 06/27/2018) 30 capsule 1 Not Taking at Unknown time    Patient Stressors: Financial difficulties Marital or family conflict Occupational concerns Substance abuse  Patient Strengths: Ability for insight Motivation for treatment/growth Work skills  Treatment Modalities: Medication Management, Group therapy, Case management,  1 to 1 session with clinician, Psychoeducation, Recreational therapy.   Physician Treatment Plan for Primary Diagnosis: <principal problem not specified> Long Term Goal(s): Improvement in symptoms so as ready for discharge Improvement in symptoms so as ready for discharge   Short Term Goals: Ability to identify changes in lifestyle to reduce recurrence of condition will improve Ability to verbalize feelings will improve Ability to demonstrate self-control will improve Ability to identify and develop effective coping behaviors will improve Compliance with prescribed medications will improve Ability to identify triggers associated with substance abuse/mental health issues will improve Ability to identify changes in lifestyle to reduce recurrence of condition will improve Ability to verbalize feelings will  improve Ability to identify and develop effective coping behaviors will improve Ability to maintain clinical measurements within normal limits will improve Compliance with prescribed medications will improve Ability to identify triggers associated with substance abuse/mental health issues will improve  Medication Management: Evaluate patient's response, side effects, and tolerance of medication regimen.  Therapeutic  Interventions: 1 to 1 sessions, Unit Group sessions and Medication administration.  Evaluation of Outcomes: Progressing  Physician Treatment Plan for Secondary Diagnosis: Active Problems:   Recurrent major depression-severe (HCC)   Cannabis abuse, in remission   Cocaine abuse in remission (HCC)   Opioid abuse, in remission (HCC)  Long Term Goal(s): Improvement in symptoms so as ready for discharge Improvement in symptoms so as ready for discharge   Short Term Goals: Ability to identify changes in lifestyle to reduce recurrence of condition will improve Ability to verbalize feelings will improve Ability to demonstrate self-control will improve Ability to identify and develop effective coping behaviors will improve Compliance with prescribed medications will improve Ability to identify triggers associated with substance abuse/mental health issues will improve Ability to identify changes in lifestyle to reduce recurrence of condition will improve Ability to verbalize feelings will improve Ability to identify and develop effective coping behaviors will improve Ability to maintain clinical measurements within normal limits will improve Compliance with prescribed medications will improve Ability to identify triggers associated with substance abuse/mental health issues will improve     Medication Management: Evaluate patient's response, side effects, and tolerance of medication regimen.  Therapeutic Interventions: 1 to 1 sessions, Unit Group sessions and Medication administration.  Evaluation of Outcomes: Progressing   RN Treatment Plan for Primary Diagnosis: <principal problem not specified> Long Term Goal(s): Knowledge of disease and therapeutic regimen to maintain health will improve  Short Term Goals: Ability to verbalize frustration and anger appropriately will improve, Ability to demonstrate self-control, Ability to participate in decision making will improve, Ability to verbalize  feelings will improve and Ability to disclose and discuss suicidal ideas  Medication Management: RN will administer medications as ordered by provider, will assess and evaluate patient's response and provide education to patient for prescribed medication. RN will report any adverse and/or side effects to prescribing provider.  Therapeutic Interventions: 1 on 1 counseling sessions, Psychoeducation, Medication administration, Evaluate responses to treatment, Monitor vital signs and CBGs as ordered, Perform/monitor CIWA, COWS, AIMS and Fall Risk screenings as ordered, Perform wound care treatments as ordered.  Evaluation of Outcomes: Progressing   LCSW Treatment Plan for Primary Diagnosis: <principal problem not specified> Long Term Goal(s): Safe transition to appropriate next level of care at discharge, Engage patient in therapeutic group addressing interpersonal concerns.  Short Term Goals: Engage patient in aftercare planning with referrals and resources, Increase social support, Increase ability to appropriately verbalize feelings, Increase emotional regulation and Facilitate acceptance of mental health diagnosis and concerns  Therapeutic Interventions: Assess for all discharge needs, 1 to 1 time with Social worker, Explore available resources and support systems, Assess for adequacy in community support network, Educate family and significant other(s) on suicide prevention, Complete Psychosocial Assessment, Interpersonal group therapy.  Evaluation of Outcomes: Progressing   Progress in Treatment: Attending groups: No. Participating in groups: No. Taking medication as prescribed: Yes. Toleration medication: Yes. Family/Significant other contact made: No, will contact:  once permission given. Patient understands diagnosis: Yes. Discussing patient identified problems/goals with staff: Yes. Medical problems stabilized or resolved: Yes. Denies suicidal/homicidal ideation:  Yes. Issues/concerns per patient self-inventory: No. Other: none  New problem(s) identified: No, Describe:  none  New Short Term/Long Term Goal(s):  Patient Goals:    Discharge Plan or Barriers:   Reason for Continuation of Hospitalization: Aggression Anxiety Depression Medication stabilization  Estimated Length of Stay: 1-5 days   Recreational Therapy: Patient Stressors: N/A Patient Goal: Patient will identify 3 resources in their community to aid in recovery within 5 recreation therapy group sessions  Attendees: Patient: Ricky Campbell 06/30/2018 4:12 PM  Physician: Dr. Toni Amend, MD 06/30/2018 4:12 PM  Nursing:  06/30/2018 4:12 PM  RN Care Manager: 06/30/2018 4:12 PM  Social Worker: Penni Homans, LCSW 06/30/2018 4:12 PM  Recreational Therapist: Hilbert Bible, LRT 06/30/2018 4:12 PM  Other: Lowella Dandy, LCSW 06/30/2018 4:12 PM  Other: Iris Pert, LCSW 06/30/2018 4:12 PM  Other: 06/30/2018 4:12 PM    Scribe for Treatment Team: Harden Mo, LCSW 06/30/2018 4:12 PM

## 2018-06-30 NOTE — Progress Notes (Signed)
Recreation Therapy Notes   Date: 06/30/2018  Time: 9:30 am  Location: Craft Room  Behavioral response: Appropriate  Intervention Topic: Anger Management  Discussion/Intervention:  Group content on today was focused on anger management. The group defined anger and reasons they become angry. Individuals expressed negative way they have dealt with anger in the past. Patients stated some positive ways they could deal with anger in the future. The group described how anger can affect your health and daily plans. Individuals participated in the intervention "Score your anger" where they had a chance to answer questions about themselves and get a score of their anger.  Clinical Observations/Feedback:  Patient came to group and defined anger management as an everyday constant thing that gets old. He explained that his family makes him angry a lot and that he get angry pretty often. Participant expressed that a reason anger management is important is so that he does not explode.Individual participated in the intervention during group. Chana Lindstrom LRT/CTRS         Kelsha Older 06/30/2018 10:42 AM

## 2018-07-01 LAB — URINALYSIS, ROUTINE W REFLEX MICROSCOPIC
BILIRUBIN URINE: NEGATIVE
Glucose, UA: NEGATIVE mg/dL
Hgb urine dipstick: NEGATIVE
Ketones, ur: NEGATIVE mg/dL
Leukocytes,Ua: NEGATIVE
Nitrite: NEGATIVE
Protein, ur: NEGATIVE mg/dL
Specific Gravity, Urine: 1.027 (ref 1.005–1.030)
pH: 5 (ref 5.0–8.0)

## 2018-07-01 NOTE — Progress Notes (Signed)
Patient instructed on the need for a urine sample. Pt. Provided with sampling cup and educated on collection methods. Pt. Verbalizes he will provide when able.

## 2018-07-01 NOTE — [facility] Suicide Risk Assessment (Signed)
[facility] INPATIENT:  Family/Significant Other Suicide Prevention Education  Suicide Prevention Education:  Contact Attempts: Louie Odegaard, father, [phone] has been identified by the patient as the family member/significant other with whom the patient will be residing, and identified as the person(s) who will aid the patient in the event of a mental health crisis.  With written consent from the patient, two attempts were made to provide suicide prevention education, prior to and/or following the patient's discharge.  We were unsuccessful in providing suicide prevention education.  A suicide education pamphlet was given to the patient to share with family/significant other.  Date and time of first attempt: 06/30/2018 at 1:15pm Date and time of second attempt: 07/01/2018 2:58pm  Harden Mo 07/01/2018, 2:57 PM

## 2018-07-01 NOTE — Progress Notes (Addendum)
D: Pt during assessments denies SI/HI/AVH, able to contract for safety. Pt is pleasant and cooperative, engages with this Clinical research associate. Pt. endorses his mood as anxious and depressed, but improved since admissions. Pt. And this writer spoke about trying to come up with ideas to be successful upon discharge. Pt. Verbalizes he agrees this would be a good idea for today.     A: Q x 15 minute observation checks to be completed for safety. Patient was provided with education.  Patient was given/offered medications per orders. Patient  was encourage to attend groups, participate in unit activities and continue with plan of care. Pt. Chart and plans of care reviewed. Pt. Given support and encouragement.   R: Patient is complaint with medications and unit procedures. Pt. Eating good, eats a good breakfast. Pt. Aware of need for UA sample, states he will provide when able.

## 2018-07-01 NOTE — [facility] Counselor (Signed)
CSW provided the patient with the number for [facility] for the patient to contact as referrals have to be made by the patient themselves.   CSW faxed over [facility] referral.    CSW checked to see if the patient had changed his mind on [employer] and [facility] or [facility], other information provided to the patient the day before to consider for treatment.  Patient again denied.   CSW called [facility] to provide the authorization number and check on status of referral.  CSW was asked to call back at 4pm to check for bed on Thursday.  Penni Homans, MSW, LCSW 07/01/2018 10:48 AM

## 2018-07-01 NOTE — Progress Notes (Signed)
Recreation Therapy Notes   Date: 07/01/2018  Time: 9:30 am   Location: Craft room   Behavioral response: N/A   Intervention Topic: Time Management  Discussion/Intervention: Patient did not attend group.   Clinical Observations/Feedback:  Patient did not attend group.   Opie Maclaughlin LRT/CTRS        Celes Dedic 07/01/2018 11:26 AM

## 2018-07-01 NOTE — [facility] Group Notes (Signed)
LCSW Group Therapy Note  07/01/2018 1:00 PM  Type of Therapy/Topic:  Group Therapy:  Feelings about Diagnosis  Participation Level:  Active   Description of Group:   This group will allow patients to explore their thoughts and feelings about diagnoses they have received. Patients will be guided to explore their level of understanding and acceptance of these diagnoses. Facilitator will encourage patients to process their thoughts and feelings about the reactions of others to their diagnosis and will guide patients in identifying ways to discuss their diagnosis with significant others in their lives. This group will be process-oriented, with patients participating in exploration of their own experiences, giving and receiving support, and processing challenge from other group members.   Therapeutic Goals: 1. Patient will demonstrate understanding of diagnosis as evidenced by identifying two or more symptoms of the disorder 2. Patient will be able to express two feelings regarding the diagnosis 3. Patient will demonstrate their ability to communicate their needs through discussion and/or role play  Summary of Patient Progress: Patient was present and attentive in group.  Patient was supportive of other group members. Patient engaged in discussion on not looking back at the past with regret but looking at as lessons learned.  Patient did engage in discussion on how his diagnosis has made others feel.    Therapeutic Modalities:   Cognitive Behavioral Therapy Brief Therapy Feelings Identification   Penni Homans, MSW, LCSW 07/01/2018 2:36 PM

## 2018-07-01 NOTE — Progress Notes (Signed)
Patient is alert and oriented x 4, affect is flat but brightens upon approach, patient was pleasant and polite with staff, he denies SI/HI/AVH, interacting appropriately with peers and staff, compliant with medication regiment, will continue to monitor.

## 2018-07-01 NOTE — Plan of Care (Signed)
Pt. Verbalizes he has been doing better since his admission. Pt. Endorses improvements in his depression and anxiety. Pt. Denies si/hi/avh, able to contract for safety.    Problem: Education: Goal: Emotional status will improve Outcome: Progressing Goal: Mental status will improve Outcome: Progressing   Problem: Coping: Goal: Will verbalize feelings Outcome: Progressing

## 2018-07-01 NOTE — [facility] Counselor (Signed)
CSw called to follow up with [facility] as instructed this morning.    CSW was informed that patient was denied [facility] bed.    Penni Homans, MSW, LCSW 07/01/2018 3:47 PM

## 2018-07-01 NOTE — Progress Notes (Signed)
[name] Progress Note  07/01/2018 6:28 PM Ricky Campbell  MRN:  [id number] Subjective: Follow-up for this patient with depression and substance abuse.  Patient says he is still having some suicidal thoughts although it is diminished from previously.  Auditory hallucinations are mostly gone except when he is falling asleep.  No acute homicidal ideation.  Patient is requesting extra food.  He goes to some groups but often stays isolated.  No new physical complaints otherwise.  We are looking at substance abuse inpatient options Principal Problem: Recurrent major depression-severe (HCC) Diagnosis: Principal Problem:   Recurrent major depression-severe (HCC) Active Problems:   Cannabis abuse, in remission   Cocaine abuse in remission (HCC)   Opioid abuse, in remission (HCC)  Total Time spent with patient: 30 minutes  Past Psychiatric History: Patient has a history of depression mood instability substance abuse  Past Medical History:  Past Medical History:  Diagnosis Date  . Bipolar disorder (HCC)   . Chronic neck and back pain   . Chronic post-traumatic stress disorder (PTSD) 01/07/2016  . Hidradenitis   . Schizophrenia [facility])     Past Surgical History:  Procedure Laterality Date  . HAND SURGERY     Family History: History reviewed. No pertinent family history. Family Psychiatric  History: See previous Social History:  Social History   Substance and Sexual Activity  Alcohol Use No  . Frequency: Never     Social History   Substance and Sexual Activity  Drug Use Yes  . Frequency: 7.0 times per week  . Types: Benzodiazepines, Heroin, Cocaine, Marijuana   Comment: uses daily, heroin; last use 2 weeks ago    Social History   Socioeconomic History  . Marital status: Legally Separated    Spouse name: Not on file  . Number of children: Not on file  . Years of education: Not on file  . Highest education level: Not on file  Occupational History  . Not on file  Social  Needs  . Financial resource strain: Not on file  . Food insecurity:    Worry: Not on file    Inability: Not on file  . Transportation needs:    Medical: Not on file    Non-medical: Not on file  Tobacco Use  . Smoking status: Current Every Day Smoker    Packs/day: 1.00    Years: 15.00    Pack years: 15.00  . Smokeless tobacco: Never Used  Substance and Sexual Activity  . Alcohol use: No    Frequency: Never  . Drug use: Yes    Frequency: 7.0 times per week    Types: Benzodiazepines, Heroin, Cocaine, Marijuana    Comment: uses daily, heroin; last use 2 weeks ago  . Sexual activity: Not on file  Lifestyle  . Physical activity:    Days per week: Not on file    Minutes per session: Not on file  . Stress: Not on file  Relationships  . Social connections:    Talks on phone: Not on file    Gets together: Not on file    Attends religious service: Not on file    Active member of club or organization: Not on file    Attends meetings of clubs or organizations: Not on file    Relationship status: Not on file  Other Topics Concern  . Not on file  Social History Narrative  . Not on file   Additional Social History:  Sleep: Fair  Appetite:  Fair  Current Medications: Current Facility-Administered Medications  Medication Dose Route Frequency Provider Last Rate Last Dose  . acetaminophen (TYLENOL) tablet 650 mg  650 mg Oral Q4H PRN Terance Hart, MD      . alum & mag hydroxide-simeth (MAALOX/MYLANTA) 200-200-20 MG/5ML suspension 15 mL  15 mL Oral Q4H PRN Katheran Awe C, MD      . gabapentin (NEURONTIN) capsule 300 mg  300 mg Oral BID Darliss Ridgel, MD   300 mg at 07/01/18 1651  . haloperidol (HALDOL) tablet 5 mg  5 mg Oral BID PRN Terance Hart, MD      . hydrOXYzine (ATARAX/VISTARIL) tablet 25 mg  25 mg Oral Q6H PRN Terance Hart, MD   25 mg at 07/01/18 1651  . magnesium hydroxide (MILK OF MAGNESIA) suspension 30 mL  30 mL  Oral Daily PRN Terance Hart, MD      . nicotine (NICODERM CQ - dosed in mg/24 hours) patch 14 mg  14 mg Transdermal Daily Darliss Ridgel, MD   14 mg at 07/01/18 0757  . QUEtiapine (SEROQUEL) tablet 200 mg  200 mg Oral QHS Clapacs, John T, MD   200 mg at 06/30/18 2147  . traZODone (DESYREL) tablet 100 mg  100 mg Oral QHS PRN Terance Hart, MD   100 mg at 06/30/18 2147  . venlafaxine [facility]) tablet 75 mg  75 mg Oral QPC breakfast Darliss Ridgel, MD   75 mg at 07/01/18 6160    Lab Results:  Results for orders placed or performed during the hospital encounter of 06/28/18 (from the past 48 hour(s))  Urinalysis, Routine w reflex microscopic     Status: Abnormal   Collection Time: 07/01/18 12:15 PM  Result Value Ref Range   Color, Urine YELLOW (A) YELLOW   APPearance CLEAR (A) CLEAR   Specific Gravity, Urine 1.027 1.005 - 1.030   pH 5.0 5.0 - 8.0   Glucose, UA NEGATIVE NEGATIVE mg/dL   Hgb urine dipstick NEGATIVE NEGATIVE   Bilirubin Urine NEGATIVE NEGATIVE   Ketones, ur NEGATIVE NEGATIVE mg/dL   Protein, ur NEGATIVE NEGATIVE mg/dL   Nitrite NEGATIVE NEGATIVE   Leukocytes,Ua NEGATIVE NEGATIVE    Comment: Performed at [facility], [address] [place], [place] [address]    Blood Alcohol level:  Lab Results  Component Value Date   [facility] <10 06/27/2018   ETH <10 06/29/2017    Metabolic Disorder Labs: Lab Results  Component Value Date   HGBA1C 5.6 06/29/2018   MPG 114.02 06/29/2018   MPG 111 01/08/2016   No results found for: PROLACTIN Lab Results  Component Value Date   CHOL 275 (H) 06/29/2018   TRIG 178 (H) 06/29/2018   HDL 64 06/29/2018   CHOLHDL 4.3 06/29/2018   VLDL 36 06/29/2018   LDLCALC 175 (H) 06/29/2018   LDLCALC 167 (H) 01/08/2016    Physical Findings: AIMS:  , ,  ,  ,    CIWA:    COWS:     Musculoskeletal: Strength & Muscle Tone: within normal limits Gait & Station: normal Patient leans: N/A  Psychiatric Specialty  Exam: Physical Exam  Nursing note and vitals reviewed. Constitutional: He appears well-developed and well-nourished.  HENT:  Head: Normocephalic and atraumatic.  Eyes: Pupils are equal, round, and reactive to light. Conjunctivae are normal.  Neck: Normal range of motion.  Cardiovascular: Regular rhythm and normal heart sounds.  Respiratory: Effort normal. No respiratory distress.  GI: Soft.  Musculoskeletal: Normal range of motion.  Neurological: He is alert.  Skin: Skin is warm and dry.  Psychiatric: Judgment normal. His affect is blunt. His speech is delayed. He is slowed. Cognition and memory are normal. He expresses suicidal ideation. He expresses no suicidal plans.    Review of Systems  Constitutional: Negative.   HENT: Negative.   Eyes: Negative.   Respiratory: Negative.   Cardiovascular: Negative.   Gastrointestinal: Negative.   Musculoskeletal: Negative.   Skin: Negative.   Neurological: Negative.   Psychiatric/Behavioral: Positive for depression and suicidal ideas. Negative for hallucinations and substance abuse.    Blood pressure 96/82, pulse 96, temperature 97.7 F (36.5 C), temperature source Oral, resp. rate 16, height  (1.803 m), weight 83.5 kg, SpO2 99 %.Body mass index is 25.66 kg/m.  General Appearance: Casual  Eye Contact:  Fair  Speech:  Clear and Coherent  Volume:  Normal  Mood:  Depressed  Affect:  Constricted  Thought Process:  Coherent  Orientation:  Full (Time, Place, and Person)  Thought Content:  Illogical  Suicidal Thoughts:  No  Homicidal Thoughts:  No  Memory:  Immediate;   Fair Recent;   Fair Remote;   Fair  Judgement:  Fair  Insight:  Fair  Psychomotor Activity:  Decreased  Concentration:  Concentration: Fair  Recall:  [employer] of Knowledge:  Fair  Language:  Fair  Akathisia:  No  Handed:  Right  AIMS (if indicated):     Assets:  Desire for Improvement Physical Health Resilience  ADL's:  Intact  Cognition:  WNL  Sleep:   Number of Hours: 7     Treatment Plan Summary: Daily contact with patient to assess and evaluate symptoms and progress in treatment, Medication management and Plan No change to psychiatric medication.  Encourage involvement in groups.  Continue monitoring for signs of dangerousness.  Working on substance abuse referrals especially to the alcohol and drug abuse treatment center.  Mordecai Rasmussen, MD 07/01/2018, 6:28 PM

## 2018-07-02 NOTE — Progress Notes (Signed)
Pt in dayroom watching TV at shift change.  Pt denies SI, HI and AVH.  Pt verbally contracts for safety.  Pt sts he does have some back pain.  Pt attends group and is medication compliant.   Pt given PRN pain medication with evening meds.  Pt returns to room. Pt remains safe on unit.

## 2018-07-02 NOTE — Plan of Care (Signed)
  Problem: Coping: Goal: Will verbalize feelings Outcome: Progressing  Patient verbalized feelings to writer rated depression a 0/10 .

## 2018-07-02 NOTE — Progress Notes (Signed)
Patient alert and oriented x 4, denies SI/HI/AVH no distress noted, isolates to self minimal interaction with Clinical research associate, receptive to staff, and complaint with medication regimen. 15 minutes safety checks maintained will continue to monitor closely.

## 2018-07-02 NOTE — Progress Notes (Signed)
Recreation Therapy Notes  Date: 07/02/2018  Time: 9:30 am   Location: Craft room   Behavioral response: N/A   Intervention Topic: Coping Skills  Discussion/Intervention: Patient did not attend group.   Clinical Observations/Feedback:  Patient did not attend group.   Dannell Gortney LRT/CTRS        Miro Balderson 07/02/2018 11:18 AM 

## 2018-07-02 NOTE — BHH Group Notes (Signed)
Emotional Regulation 07/02/2018 1PM  Type of Therapy/Topic:  Group Therapy:  Emotion Regulation  Participation Level:  Did Not Attend   Description of Group:   The purpose of this group is to assist patients in learning to regulate negative emotions and experience positive emotions. Patients will be guided to discuss ways in which they have been vulnerable to their negative emotions. These vulnerabilities will be juxtaposed with experiences of positive emotions or situations, and patients will be challenged to use positive emotions to combat negative ones. Special emphasis will be placed on coping with negative emotions in conflict situations, and patients will process healthy conflict resolution skills.  Therapeutic Goals: 1. Patient will identify two positive emotions or experiences to reflect on in order to balance out negative emotions 2. Patient will label two or more emotions that they find the most difficult to experience 3. Patient will demonstrate positive conflict resolution skills through discussion and/or role plays  Summary of Patient Progress:       Therapeutic Modalities:   Cognitive Behavioral Therapy Feelings Identification Dialectical Behavioral Therapy   Suzan Slick, LCSW 07/02/2018 2:09 PM

## 2018-07-02 NOTE — Progress Notes (Signed)
[facility] MD Progress Note  07/02/2018 4:48 PM Ricky Campbell  MRN:  [id number] Subjective: Follow-up for this gentleman with symptoms of depression along with substance abuse.  Patient was turned down by the alcohol and drug abuse treatment center.  He was informed that he needed to take responsibility for calling our cup.  Patient does not get out of his room very much but when we go to see him he is usually awake alert affect euthymic.  Denies any acute suicidal ideation.  Does not appear to be responding to internal stimuli. Principal Problem: Recurrent major depression-severe (HCC) Diagnosis: Principal Problem:   Recurrent major depression-severe (HCC) Active Problems:   Cannabis abuse, in remission   Cocaine abuse in remission (HCC)   Opioid abuse, in remission (HCC)  Total Time spent with patient: 20 minutes  Past Psychiatric History: Patient has a history of substance abuse mood disorder noncompliance.  Past Medical History:  Past Medical History:  Diagnosis Date  . Bipolar disorder (HCC)   . Chronic neck and back pain   . Chronic post-traumatic stress disorder (PTSD) 01/07/2016  . Hidradenitis   . Schizophrenia [facility])     Past Surgical History:  Procedure Laterality Date  . HAND SURGERY     Family History: History reviewed. No pertinent family history. Family Psychiatric  History: See previous Social History:  Social History   Substance and Sexual Activity  Alcohol Use No  . Frequency: Never     Social History   Substance and Sexual Activity  Drug Use Yes  . Frequency: 7.0 times per week  . Types: Benzodiazepines, Heroin, Cocaine, Marijuana   Comment: uses daily, heroin; last use 2 weeks ago    Social History   Socioeconomic History  . Marital status: Legally Separated    Spouse name: Not on file  . Number of children: Not on file  . Years of education: Not on file  . Highest education level: Not on file  Occupational History  . Not on file  Social  Needs  . Financial resource strain: Not on file  . Food insecurity:    Worry: Not on file    Inability: Not on file  . Transportation needs:    Medical: Not on file    Non-medical: Not on file  Tobacco Use  . Smoking status: Current Every Day Smoker    Packs/day: 1.00    Years: 15.00    Pack years: 15.00  . Smokeless tobacco: Never Used  Substance and Sexual Activity  . Alcohol use: No    Frequency: Never  . Drug use: Yes    Frequency: 7.0 times per week    Types: Benzodiazepines, Heroin, Cocaine, Marijuana    Comment: uses daily, heroin; last use 2 weeks ago  . Sexual activity: Not on file  Lifestyle  . Physical activity:    Days per week: Not on file    Minutes per session: Not on file  . Stress: Not on file  Relationships  . Social connections:    Talks on phone: Not on file    Gets together: Not on file    Attends religious service: Not on file    Active member of club or organization: Not on file    Attends meetings of clubs or organizations: Not on file    Relationship status: Not on file  Other Topics Concern  . Not on file  Social History Narrative  . Not on file   Additional Social History:  Sleep: Fair  Appetite:  Fair  Current Medications: Current Facility-Administered Medications  Medication Dose Route Frequency Provider Last Rate Last Dose  . acetaminophen (TYLENOL) tablet 650 mg  650 mg Oral Q4H PRN Terance Hart, MD   650 mg at 07/01/18 2123  . alum & mag hydroxide-simeth (MAALOX/MYLANTA) 200-200-20 MG/5ML suspension 15 mL  15 mL Oral Q4H PRN Katheran Awe C, MD      . gabapentin (NEURONTIN) capsule 300 mg  300 mg Oral BID Darliss Ridgel, MD   300 mg at 07/02/18 0803  . haloperidol (HALDOL) tablet 5 mg  5 mg Oral BID PRN Terance Hart, MD      . hydrOXYzine (ATARAX/VISTARIL) tablet 25 mg  25 mg Oral Q6H PRN Terance Hart, MD   25 mg at 07/02/18 0913  . magnesium hydroxide (MILK OF MAGNESIA)  suspension 30 mL  30 mL Oral Daily PRN Terance Hart, MD      . nicotine (NICODERM CQ - dosed in mg/24 hours) patch 14 mg  14 mg Transdermal Daily Darliss Ridgel, MD   14 mg at 07/02/18 0806  . QUEtiapine (SEROQUEL) tablet 200 mg  200 mg Oral QHS Daivik Overley T, MD   200 mg at 07/01/18 2123  . traZODone (DESYREL) tablet 100 mg  100 mg Oral QHS PRN Terance Hart, MD   100 mg at 07/01/18 2123  . venlafaxine [facility]) tablet 75 mg  75 mg Oral QPC breakfast Darliss Ridgel, MD   75 mg at 07/02/18 2952    Lab Results:  Results for orders placed or performed during the hospital encounter of 06/28/18 (from the past 48 hour(s))  Urinalysis, Routine w reflex microscopic     Status: Abnormal   Collection Time: 07/01/18 12:15 PM  Result Value Ref Range   Color, Urine YELLOW (A) YELLOW   APPearance CLEAR (A) CLEAR   Specific Gravity, Urine 1.027 1.005 - 1.030   pH 5.0 5.0 - 8.0   Glucose, UA NEGATIVE NEGATIVE mg/dL   Hgb urine dipstick NEGATIVE NEGATIVE   Bilirubin Urine NEGATIVE NEGATIVE   Ketones, ur NEGATIVE NEGATIVE mg/dL   Protein, ur NEGATIVE NEGATIVE mg/dL   Nitrite NEGATIVE NEGATIVE   Leukocytes,Ua NEGATIVE NEGATIVE    Comment: Performed at [facility], [address] [place], [place] [address]    Blood Alcohol level:  Lab Results  Component Value Date   [facility] <10 06/27/2018   ETH <10 06/29/2017    Metabolic Disorder Labs: Lab Results  Component Value Date   HGBA1C 5.6 06/29/2018   MPG 114.02 06/29/2018   MPG 111 01/08/2016   No results found for: PROLACTIN Lab Results  Component Value Date   CHOL 275 (H) 06/29/2018   TRIG 178 (H) 06/29/2018   HDL 64 06/29/2018   CHOLHDL 4.3 06/29/2018   VLDL 36 06/29/2018   LDLCALC 175 (H) 06/29/2018   LDLCALC 167 (H) 01/08/2016    Physical Findings: AIMS:  , ,  ,  ,    CIWA:    COWS:     Musculoskeletal: Strength & Muscle Tone: within normal limits Gait & Station: normal Patient leans: N/A  Psychiatric  Specialty Exam: Physical Exam  Nursing note and vitals reviewed. Constitutional: He appears well-developed and well-nourished.  HENT:  Head: Normocephalic and atraumatic.  Eyes: Pupils are equal, round, and reactive to light. Conjunctivae are normal.  Neck: Normal range of motion.  Cardiovascular: Regular rhythm and normal heart sounds.  Respiratory: Effort normal.  GI: Soft.  Musculoskeletal: Normal range of motion.  Neurological: He is alert.  Skin: Skin is warm and dry.  Psychiatric: His affect is blunt. His speech is delayed. He is slowed. Cognition and memory are impaired. He expresses impulsivity. He expresses no homicidal and no suicidal ideation.    Review of Systems  Constitutional: Negative.   HENT: Negative.   Eyes: Negative.   Respiratory: Negative.   Cardiovascular: Negative.   Gastrointestinal: Negative.   Musculoskeletal: Negative.   Skin: Negative.   Neurological: Negative.   Psychiatric/Behavioral: Positive for depression.    Blood pressure 117/85, pulse 85, temperature 98 F (36.7 C), temperature source Oral, resp. rate 16, height  (1.803 m), weight 83.5 kg, SpO2 100 %.Body mass index is 25.66 kg/m.  General Appearance: Casual  Eye Contact:  Fair  Speech:  Normal Rate  Volume:  Decreased  Mood:  Depressed  Affect:  Appropriate  Thought Process:  Goal Directed  Orientation:  Full (Time, Place, and Person)  Thought Content:  Logical  Suicidal Thoughts:  No  Homicidal Thoughts:  No  Memory:  Immediate;   Fair Recent;   Poor Remote;   Fair  Judgement:  Fair  Insight:  Fair  Psychomotor Activity:  Decreased  Concentration:  Concentration: Fair  Recall:  [employer] of Knowledge:  Fair  Language:  Fair  Akathisia:  No  Handed:  Right  AIMS (if indicated):     Assets:  Desire for Improvement  ADL's:  Intact  Cognition:  WNL  Sleep:  Number of Hours: 6.75     Treatment Plan Summary: Daily contact with patient to assess and evaluate  symptoms and progress in treatment, Medication management and Plan We will speak to patient tomorrow about discharge options.  If [facility] is available we can pursue that briefly but probably we will need to look at disposition.  He seems to be stabilizing and tolerating medicines without difficulty.  Mordecai Rasmussen, MD 07/02/2018, 4:48 PM

## 2018-07-03 MED ORDER — TRAZODONE HCL 100 MG PO TABS: 100.0000 mg | ORAL_TABLET | Freq: Every evening | ORAL | 0 refills | Status: DC | PRN

## 2018-07-03 MED ORDER — QUETIAPINE FUMARATE 200 MG PO TABS: 200.0000 mg | ORAL_TABLET | Freq: Every day | ORAL | 1 refills | Status: DC

## 2018-07-03 MED ORDER — HYDROXYZINE HCL 25 MG PO TABS: 25.0000 mg | ORAL_TABLET | Freq: Four times a day (QID) | ORAL | 0 refills | Status: DC | PRN

## 2018-07-03 MED ORDER — TRAZODONE HCL 100 MG PO TABS: 100.0000 mg | ORAL_TABLET | Freq: Every evening | ORAL | 1 refills | Status: DC | PRN

## 2018-07-03 MED ORDER — GABAPENTIN 300 MG PO CAPS: 300.0000 mg | ORAL_CAPSULE | Freq: Two times a day (BID) | ORAL | 0 refills | Status: DC

## 2018-07-03 MED ORDER — HALOPERIDOL 5 MG PO TABS: 5.0000 mg | ORAL_TABLET | Freq: Two times a day (BID) | ORAL | 1 refills | Status: AC | PRN

## 2018-07-03 MED ORDER — GABAPENTIN 300 MG PO CAPS: 300.0000 mg | ORAL_CAPSULE | Freq: Two times a day (BID) | ORAL | 1 refills | Status: AC

## 2018-07-03 MED ORDER — QUETIAPINE FUMARATE 200 MG PO TABS: 200.0000 mg | ORAL_TABLET | Freq: Every day | ORAL | 0 refills | Status: DC

## 2018-07-03 MED ORDER — VENLAFAXINE HCL ER 150 MG PO CP24: 150.0000 mg | ORAL_CAPSULE | Freq: Every day | ORAL | 1 refills | Status: AC

## 2018-07-03 MED ORDER — VENLAFAXINE HCL 75 MG PO TABS: 75.0000 mg | ORAL_TABLET | Freq: Every day | ORAL | 0 refills | Status: DC

## 2018-07-03 MED ORDER — HYDROXYZINE HCL 25 MG PO TABS: 25.0000 mg | ORAL_TABLET | Freq: Four times a day (QID) | ORAL | 1 refills | Status: AC | PRN

## 2018-07-03 NOTE — Progress Notes (Signed)
Pt denies suicidal ideation and hallucinations. He scores a five on the scale for depression and anxiety. He is not attending groups at the time because he says he needs to rest. I will continue to monitor. Torrie Mayers RN

## 2018-07-03 NOTE — Progress Notes (Signed)
[facility] MD Progress Note  07/03/2018 5:09 PM Ricky Campbell  MRN:  [id number] Subjective: Follow-up for this gentleman with current depression and substance abuse.  Patient has no new complaints.  Has been taking care of his ADLs and active on the unit.  No new physical problems.  Patient has been declined at all of the substance abuse facilities we have checked out.  He is not currently reporting suicidal ideation and does not appear to be psychotic. Principal Problem: Recurrent major depression-severe (HCC) Diagnosis: Principal Problem:   Recurrent major depression-severe (HCC) Active Problems:   Cannabis abuse, in remission   Cocaine abuse in remission (HCC)   Opioid abuse, in remission (HCC)  Total Time spent with patient: 20 minutes  Past Psychiatric History: Patient has a past history of substance abuse mood instability  Past Medical History:  Past Medical History:  Diagnosis Date  . Bipolar disorder (HCC)   . Chronic neck and back pain   . Chronic post-traumatic stress disorder (PTSD) 01/07/2016  . Hidradenitis   . Schizophrenia [facility])     Past Surgical History:  Procedure Laterality Date  . HAND SURGERY     Family History: History reviewed. No pertinent family history. Family Psychiatric  History: None reported Social History:  Social History   Substance and Sexual Activity  Alcohol Use No  . Frequency: Never     Social History   Substance and Sexual Activity  Drug Use Yes  . Frequency: 7.0 times per week  . Types: Benzodiazepines, Heroin, Cocaine, Marijuana   Comment: uses daily, heroin; last use 2 weeks ago    Social History   Socioeconomic History  . Marital status: Legally Separated    Spouse name: Not on file  . Number of children: Not on file  . Years of education: Not on file  . Highest education level: Not on file  Occupational History  . Not on file  Social Needs  . Financial resource strain: Not on file  . Food insecurity:    Worry: Not  on file    Inability: Not on file  . Transportation needs:    Medical: Not on file    Non-medical: Not on file  Tobacco Use  . Smoking status: Current Every Day Smoker    Packs/day: 1.00    Years: 15.00    Pack years: 15.00  . Smokeless tobacco: Never Used  Substance and Sexual Activity  . Alcohol use: No    Frequency: Never  . Drug use: Yes    Frequency: 7.0 times per week    Types: Benzodiazepines, Heroin, Cocaine, Marijuana    Comment: uses daily, heroin; last use 2 weeks ago  . Sexual activity: Not on file  Lifestyle  . Physical activity:    Days per week: Not on file    Minutes per session: Not on file  . Stress: Not on file  Relationships  . Social connections:    Talks on phone: Not on file    Gets together: Not on file    Attends religious service: Not on file    Active member of club or organization: Not on file    Attends meetings of clubs or organizations: Not on file    Relationship status: Not on file  Other Topics Concern  . Not on file  Social History Narrative  . Not on file   Additional Social History:  Sleep: Fair  Appetite:  Fair  Current Medications: Current Facility-Administered Medications  Medication Dose Route Frequency Provider Last Rate Last Dose  . acetaminophen (TYLENOL) tablet 650 mg  650 mg Oral Q4H PRN Terance Hart, MD   650 mg at 07/03/18 1509  . alum & mag hydroxide-simeth (MAALOX/MYLANTA) 200-200-20 MG/5ML suspension 15 mL  15 mL Oral Q4H PRN Katheran Awe C, MD      . gabapentin (NEURONTIN) capsule 300 mg  300 mg Oral BID Darliss Ridgel, MD   300 mg at 07/03/18 1700  . haloperidol (HALDOL) tablet 5 mg  5 mg Oral BID PRN Terance Hart, MD      . hydrOXYzine (ATARAX/VISTARIL) tablet 25 mg  25 mg Oral Q6H PRN Terance Hart, MD   25 mg at 07/03/18 1610  . magnesium hydroxide (MILK OF MAGNESIA) suspension 30 mL  30 mL Oral Daily PRN Terance Hart, MD      . nicotine  (NICODERM CQ - dosed in mg/24 hours) patch 14 mg  14 mg Transdermal Daily Darliss Ridgel, MD   14 mg at 07/03/18 0814  . QUEtiapine (SEROQUEL) tablet 200 mg  200 mg Oral QHS Lashon Beringer T, MD   200 mg at 07/02/18 2114  . traZODone (DESYREL) tablet 100 mg  100 mg Oral QHS PRN Terance Hart, MD   100 mg at 07/02/18 2114  . venlafaxine (EFFEXOR) tablet 75 mg  75 mg Oral QPC breakfast Darliss Ridgel, MD   75 mg at 07/03/18 9604    Lab Results: No results found for this or any previous visit (from the past 48 hour(s)).  Blood Alcohol level:  Lab Results  Component Value Date   ETH <10 06/27/2018   ETH <10 06/29/2017    Metabolic Disorder Labs: Lab Results  Component Value Date   HGBA1C 5.6 06/29/2018   MPG 114.02 06/29/2018   MPG 111 01/08/2016   No results found for: PROLACTIN Lab Results  Component Value Date   CHOL 275 (H) 06/29/2018   TRIG 178 (H) 06/29/2018   HDL 64 06/29/2018   CHOLHDL 4.3 06/29/2018   VLDL 36 06/29/2018   LDLCALC 175 (H) 06/29/2018   LDLCALC 167 (H) 01/08/2016    Physical Findings: AIMS:  , ,  ,  ,    CIWA:    COWS:     Musculoskeletal: Strength & Muscle Tone: within normal limits Gait & Station: normal Patient leans: N/A  Psychiatric Specialty Exam: Physical Exam  Nursing note and vitals reviewed. Constitutional: He appears well-developed and well-nourished.  HENT:  Head: Normocephalic and atraumatic.  Eyes: Pupils are equal, round, and reactive to light. Conjunctivae are normal.  Neck: Normal range of motion.  Cardiovascular: Regular rhythm and normal heart sounds.  Respiratory: Effort normal. No respiratory distress.  GI: Soft.  Musculoskeletal: Normal range of motion.  Neurological: He is alert.  Skin: Skin is warm and dry.  Psychiatric: He has a normal mood and affect. His behavior is normal. Judgment and thought content normal.    Review of Systems  Constitutional: Negative.   HENT: Negative.   Eyes: Negative.    Respiratory: Negative.   Cardiovascular: Negative.   Gastrointestinal: Negative.   Musculoskeletal: Negative.   Skin: Negative.   Neurological: Negative.   Psychiatric/Behavioral: Negative.     Blood pressure 116/77, pulse 82, temperature 97.8 F (36.6 C), temperature source Oral, resp. rate 18, height  (1.803 m), weight 83.5 kg, SpO2 98 %.Body mass  index is 25.66 kg/m.  General Appearance: Fairly Groomed  Eye Contact:  Fair  Speech:  Normal Rate  Volume:  Decreased  Mood:  Irritable  Affect:  Constricted  Thought Process:  Coherent  Orientation:  Full (Time, Place, and Person)  Thought Content:  Logical  Suicidal Thoughts:  No  Homicidal Thoughts:  No  Memory:  Immediate;   Fair Recent;   Fair Remote;   Fair  Judgement:  Fair  Insight:  Fair  Psychomotor Activity:  Normal  Concentration:  Concentration: Fair  Recall:  [employer] of Knowledge:  Fair  Language:  Fair  Akathisia:  No  Handed:  Right  AIMS (if indicated):     Assets:  Desire for Improvement Physical Health Resilience Social Support  ADL's:  Intact  Cognition:  WNL  Sleep:  Number of Hours: 7.75     Treatment Plan Summary: Daily contact with patient to assess and evaluate symptoms and progress in treatment, Medication management and Plan Patient appears to have stabilized and is likely to have gained as much benefit as there is in being in the hospital.  Explained to patient that our options are running out as far as disposition.  We will plan to try and discharge him tomorrow to what ever facility might be available.  Prescriptions will be prepared as well 7-day supply.  Mordecai Rasmussen, MD 07/03/2018, 5:09 PM

## 2018-07-03 NOTE — BHH Suicide Risk Assessment (Signed)
[facility] Discharge Suicide Risk Assessment   Principal Problem: Recurrent major depression-severe [facility]) Discharge Diagnoses: Principal Problem:   Recurrent major depression-severe (HCC) Active Problems:   Cannabis abuse, in remission   Cocaine abuse in remission (HCC)   Opioid abuse, in remission (HCC)   Total Time spent with patient: 45 minutes  Musculoskeletal: Strength & Muscle Tone: within normal limits Gait & Station: normal Patient leans: N/A  Psychiatric Specialty Exam: Review of Systems  Constitutional: Negative.   HENT: Negative.   Eyes: Negative.   Respiratory: Negative.   Cardiovascular: Negative.   Gastrointestinal: Negative.   Musculoskeletal: Negative.   Skin: Negative.   Neurological: Negative.   Psychiatric/Behavioral: Negative.     Blood pressure 116/77, pulse 82, temperature 97.8 F (36.6 C), temperature source Oral, resp. rate 18, height 5\' 11"  (1.803 m), weight 83.5 kg, SpO2 98 %.Body mass index is 25.66 kg/m.  General Appearance: Casual  Eye Contact::  Good  Speech:  Clear and Coherent409  Volume:  Normal  Mood:  Euthymic  Affect:  Congruent  Thought Process:  Coherent  Orientation:  Full (Time, Place, and Person)  Thought Content:  Logical  Suicidal Thoughts:  No  Homicidal Thoughts:  No  Memory:  Immediate;   Fair Recent;   Fair Remote;   Fair  Judgement:  Fair  Insight:  Fair  Psychomotor Activity:  Normal  Concentration:  Fair  Recall:  [employer] of Knowledge:Fair  Language: Fair  Akathisia:  No  Handed:  Right  AIMS (if indicated):     Assets:  Desire for Improvement Physical Health  Sleep:  Number of Hours: 7.75  Cognition: WNL  ADL's:  Intact   Mental Status Per Nursing Assessment::   On Admission:  Self-harm thoughts  Demographic Factors:  Male, Low socioeconomic status, Living alone and Unemployed  Loss Factors: Decrease in vocational status  Historical Factors: Impulsivity  Risk Reduction Factors:   Religious  beliefs about death and Positive therapeutic relationship  Continued Clinical Symptoms:  Depression:   Anhedonia Alcohol/Substance Abuse/Dependencies  Cognitive Features That Contribute To Risk:  Polarized thinking    Suicide Risk:  Minimal: No identifiable suicidal ideation.  Patients presenting with no risk factors but with morbid ruminations; may be classified as minimal risk based on the severity of the depressive symptoms  Follow-up Information    [facility] Follow up.   Specialty:  Addiction Medicine Contact information: [address] [place] [address] [phone]        [facility] .   Contact information: [address] [place] [address] [phone]           Plan Of Care/Follow-up recommendations:  Activity:  Activity as tolerated Diet:  Regular diet Other:  Follow-up with outpatient mental health care and substance abuse treatment  Mordecai Rasmussen, MD 07/03/2018, 5:15 PM

## 2018-07-03 NOTE — BHH Counselor (Signed)
CSW called [facility] and [facility] with the patient present.  Patient was to call these on his own on Tuesday and Wednesday however did not.  Patient needs to call as both places require patient to make referral themselves.  [facility] reports that they are no longer accepting patients.  [facility], pt had to leave a message.  Penni Homans, MSW, LCSW 07/03/2018 2:05 PM

## 2018-07-03 NOTE — BHH Group Notes (Signed)
LCSW Group Therapy Note  07/03/2018 12:41 PM  Type of Therapy/Topic:  Group Therapy:  Balance in Life  Participation Level:  Did Not Attend  Description of Group:    This group will address the concept of balance and how it feels and looks when one is unbalanced. Patients will be encouraged to process areas in their lives that are out of balance and identify reasons for remaining unbalanced. Facilitators will guide patients in utilizing problem-solving interventions to address and correct the stressor making their life unbalanced. Understanding and applying boundaries will be explored and addressed for obtaining and maintaining a balanced life. Patients will be encouraged to explore ways to assertively make their unbalanced needs known to significant others in their lives, using other group members and facilitator for support and feedback.  Therapeutic Goals: 1. Patient will identify two or more emotions or situations they have that consume much of in their lives. 2. Patient will identify signs/triggers that life has become out of balance:  3. Patient will identify two ways to set boundaries in order to achieve balance in their lives:  4. Patient will demonstrate ability to communicate their needs through discussion and/or role plays  Summary of Patient Progress:  x    Therapeutic Modalities:   Cognitive Behavioral Therapy Solution-Focused Therapy Assertiveness Training  Demarques Pilz, MSW, LCSW Clinical Social Work 07/03/2018 12:41 PM   

## 2018-07-03 NOTE — Progress Notes (Signed)
Recreation Therapy Notes  Date: 07/03/2018  Time: 9:30 am   Location: Craft room   Behavioral response: N/A   Intervention Topic: Happiness  Discussion/Intervention: Patient did not attend group.   Clinical Observations/Feedback:  Patient did not attend group.   Savanah Bayles LRT/CTRS        Stylianos Stradling 07/03/2018 10:26 AM

## 2018-07-03 NOTE — BHH Counselor (Signed)
CSW followed up with the patient after progression to determine if patient had called [facility].  Pt reports that he has not.    CSW infomred that [facility] denied patient due to primary diagnosis being mental health and pt having a month clean.  IT was recommended that patient try IOP.  Pt reports that he does not have transportation to IOP.   Penni Homans, MSW, LCSW 07/03/2018 9:15 AM

## 2018-07-03 NOTE — Progress Notes (Signed)
Pastoral Care visit   07/03/18 1500  Clinical Encounter Type  Visited With Patient  Visit Type Follow-up;Psychological support;Spiritual support;Behavioral Health  Referral From Patient  Consult/Referral To Chaplain  Spiritual Encounters  Spiritual Needs Prayer;Emotional  Stress Factors  Patient Stress Factors Family relationships;Financial concerns;Loss of control   Pt approached to talk w/ Chap. Pt shared about challenges with his family and desire to get his life cleaned up so he can be a good role model for his 45 yr old son, Dream.  Mirna Mires provided empathy, active listening, and prayed (upon request) for pt.  Milinda Antis, [address].

## 2018-07-03 NOTE — Plan of Care (Signed)
Pt has been educated. Pt has expressed his score of a five regarding his anxiety and depression. He denies SI and hallucinations. He is being educated on his therapy here at [facility]. Torrie Mayers RN

## 2018-07-04 NOTE — Progress Notes (Signed)
Recreation Therapy Notes  Date: 07/04/2018  Time: 9:30 am   Location: Craft room   Behavioral response: N/A   Intervention Topic: Communication  Discussion/Intervention: Patient did not attend group.   Clinical Observations/Feedback:  Patient did not attend group.   Delenn Ahn LRT/CTRS        Alfons Sulkowski 07/04/2018 10:27 AM

## 2018-07-04 NOTE — BHH Counselor (Signed)
CSW spoke with the patient regarding the patient's discharge.  Patient requsted to call father. CSW got fathers number and called father, however was unable to speak with him and proovided a HIPAA compliant voicemail.    CSW had to step away for an important phone call, however, returned to patient to discuss the bus schedules and plans.  Patient reported that he did not understand the schedule.  CSW attempted to explain the schedule to him, however, the patient walked away cursing and stated that he was not going to go home by bus.  Patient reports that father would be by to pick up the patient at 6.  CSW is unsure if patient was able to talk to father as father did not answer previous call and phones on unit are off.   CSW called the patients sister to inform her of the plan, pt's behaviors and attempt to confirm if father will pick the patient up at 6 as reported.  Sister reports that she will return call after speaking with father.    Ricky Campbell, MSW, LCSW 07/04/2018 11:16 AM

## 2018-07-04 NOTE — Tx Team (Signed)
Interdisciplinary Treatment and Diagnostic Plan Update  07/04/2018 Time of Session: 8:30AM Ricky Campbell MRN: [id number]  Principal Diagnosis: Recurrent major depression-severe [facility])  Secondary Diagnoses: Principal Problem:   Recurrent major depression-severe (HCC) Active Problems:   Cannabis abuse, in remission   Cocaine abuse in remission (HCC)   Opioid abuse, in remission (HCC)   Current Medications:  Current Facility-Administered Medications  Medication Dose Route Frequency Provider Last Rate Last Dose  . acetaminophen (TYLENOL) tablet 650 mg  650 mg Oral Q4H PRN Terance Hart, MD   650 mg at 07/03/18 1509  . alum & mag hydroxide-simeth (MAALOX/MYLANTA) 200-200-20 MG/5ML suspension 15 mL  15 mL Oral Q4H PRN Katheran Awe C, MD      . gabapentin (NEURONTIN) capsule 300 mg  300 mg Oral BID Darliss Ridgel, MD   300 mg at 07/04/18 0804  . haloperidol (HALDOL) tablet 5 mg  5 mg Oral BID PRN Terance Hart, MD      . hydrOXYzine (ATARAX/VISTARIL) tablet 25 mg  25 mg Oral Q6H PRN Terance Hart, MD   25 mg at 07/04/18 0805  . magnesium hydroxide (MILK OF MAGNESIA) suspension 30 mL  30 mL Oral Daily PRN Terance Hart, MD      . nicotine (NICODERM CQ - dosed in mg/24 hours) patch 14 mg  14 mg Transdermal Daily Darliss Ridgel, MD   14 mg at 07/04/18 0807  . QUEtiapine (SEROQUEL) tablet 200 mg  200 mg Oral QHS Clapacs, John T, MD   200 mg at 07/03/18 2129  . traZODone (DESYREL) tablet 100 mg  100 mg Oral QHS PRN Terance Hart, MD   100 mg at 07/03/18 2129  . venlafaxine (EFFEXOR) tablet 75 mg  75 mg Oral QPC breakfast Darliss Ridgel, MD   75 mg at 07/04/18 0805   PTA Medications: Medications Prior to Admission  Medication Sig Dispense Refill Last Dose  . haloperidol (HALDOL) 5 MG tablet Take 1 tablet (5 mg total) by mouth at bedtime. (Patient not taking: Reported on 06/29/2017) 30 tablet 1 Not Taking at Unknown time  . lamoTRIgine (LAMICTAL) 200 MG  tablet Take 1 tablet (200 mg total) by mouth at bedtime. (Patient not taking: Reported on 06/27/2018) 30 tablet 1 Not Taking at Unknown time  . naloxone HCl 4 MG/0.1ML LIQD Use as directed in case of opiate overdose. (Patient not taking: Reported on 06/27/2018) 1 each 1 Not Taking at Unknown time  . [DISCONTINUED] venlafaxine XR (EFFEXOR-XR) 150 MG 24 hr capsule Take 1 capsule (150 mg total) by mouth daily with breakfast. (Patient not taking: Reported on 06/27/2018) 30 capsule 1 Not Taking at Unknown time    Patient Stressors: Financial difficulties Marital or family conflict Occupational concerns Substance abuse  Patient Strengths: Ability for insight Motivation for treatment/growth Work skills  Treatment Modalities: Medication Management, Group therapy, Case management,  1 to 1 session with clinician, Psychoeducation, Recreational therapy.   Physician Treatment Plan for Primary Diagnosis: Recurrent major depression-severe (HCC) Long Term Goal(s): Improvement in symptoms so as ready for discharge Improvement in symptoms so as ready for discharge   Short Term Goals: Ability to identify changes in lifestyle to reduce recurrence of condition will improve Ability to verbalize feelings will improve Ability to demonstrate self-control will improve Ability to identify and develop effective coping behaviors will improve Compliance with prescribed medications will improve Ability to identify triggers associated with substance abuse/mental health issues will improve Ability to identify changes in lifestyle  to reduce recurrence of condition will improve Ability to verbalize feelings will improve Ability to identify and develop effective coping behaviors will improve Ability to maintain clinical measurements within normal limits will improve Compliance with prescribed medications will improve Ability to identify triggers associated with substance abuse/mental health issues will  improve  Medication Management: Evaluate patient's response, side effects, and tolerance of medication regimen.  Therapeutic Interventions: 1 to 1 sessions, Unit Group sessions and Medication administration.  Evaluation of Outcomes: Progressing  Physician Treatment Plan for Secondary Diagnosis: Principal Problem:   Recurrent major depression-severe (HCC) Active Problems:   Cannabis abuse, in remission   Cocaine abuse in remission (HCC)   Opioid abuse, in remission (HCC)  Long Term Goal(s): Improvement in symptoms so as ready for discharge Improvement in symptoms so as ready for discharge   Short Term Goals: Ability to identify changes in lifestyle to reduce recurrence of condition will improve Ability to verbalize feelings will improve Ability to demonstrate self-control will improve Ability to identify and develop effective coping behaviors will improve Compliance with prescribed medications will improve Ability to identify triggers associated with substance abuse/mental health issues will improve Ability to identify changes in lifestyle to reduce recurrence of condition will improve Ability to verbalize feelings will improve Ability to identify and develop effective coping behaviors will improve Ability to maintain clinical measurements within normal limits will improve Compliance with prescribed medications will improve Ability to identify triggers associated with substance abuse/mental health issues will improve     Medication Management: Evaluate patient's response, side effects, and tolerance of medication regimen.  Therapeutic Interventions: 1 to 1 sessions, Unit Group sessions and Medication administration.  Evaluation of Outcomes: Progressing   RN Treatment Plan for Primary Diagnosis: Recurrent major depression-severe (HCC) Long Term Goal(s): Knowledge of disease and therapeutic regimen to maintain health will improve  Short Term Goals: Ability to verbalize  frustration and anger appropriately will improve, Ability to demonstrate self-control, Ability to participate in decision making will improve, Ability to verbalize feelings will improve and Ability to disclose and discuss suicidal ideas  Medication Management: RN will administer medications as ordered by provider, will assess and evaluate patient's response and provide education to patient for prescribed medication. RN will report any adverse and/or side effects to prescribing provider.  Therapeutic Interventions: 1 on 1 counseling sessions, Psychoeducation, Medication administration, Evaluate responses to treatment, Monitor vital signs and CBGs as ordered, Perform/monitor CIWA, COWS, AIMS and Fall Risk screenings as ordered, Perform wound care treatments as ordered.  Evaluation of Outcomes: Progressing   LCSW Treatment Plan for Primary Diagnosis: Recurrent major depression-severe (HCC) Long Term Goal(s): Safe transition to appropriate next level of care at discharge, Engage patient in therapeutic group addressing interpersonal concerns.  Short Term Goals: Engage patient in aftercare planning with referrals and resources, Increase social support, Increase ability to appropriately verbalize feelings, Increase emotional regulation and Facilitate acceptance of mental health diagnosis and concerns  Therapeutic Interventions: Assess for all discharge needs, 1 to 1 time with Social worker, Explore available resources and support systems, Assess for adequacy in community support network, Educate family and significant other(s) on suicide prevention, Complete Psychosocial Assessment, Interpersonal group therapy.  Evaluation of Outcomes: Progressing   Progress in Treatment: Attending groups: No. Participating in groups: No. Taking medication as prescribed: Yes. Toleration medication: Yes. Family/Significant other contact made: No, will contact:  once permission given. Patient understands diagnosis:  Yes. Discussing patient identified problems/goals with staff: Yes. Medical problems stabilized or resolved: Yes. Denies suicidal/homicidal ideation:  Yes. Issues/concerns per patient self-inventory: No. Other: none  New problem(s) identified: No, Describe:  none  New Short Term/Long Term Goal(s): detox, elimination of symptoms of psychosis, medication management for mood stabilization; elimination of SI thoughts; development of comprehensive mental wellness/sobriety plan.  Patient Goals:  "not seeing things, be okay on my own"  Discharge Plan or Barriers:  Pt was scheduled for discharge on 07/04/2018 to [facility].  Patient refused to go to shelter, and walked away as plans were made.  Pt continues to decline aftercare at this time.    Reason for Continuation of Hospitalization: Aggression Anxiety Depression Medication stabilization  Estimated Length of Stay: 1-5 days  Recreational Therapy: Patient Stressors: N/A Patient Goal: Patient will identify 3 resources in their community to aid in recovery within 5 recreation therapy group sessions  Attendees: Patient: Ricky Campbell 07/04/2018 1:54 PM  Physician: Dr. Toni Amend, MD 07/04/2018 1:54 PM  Nursing:  07/04/2018 1:54 PM  RN Care Manager: 07/04/2018 1:54 PM  Social Worker: Penni Homans, LCSW 07/04/2018 1:54 PM  Recreational Therapist:  07/04/2018 1:54 PM  Other:  07/04/2018 1:54 PM  Other:  07/04/2018 1:54 PM  Other: 07/04/2018 1:54 PM    Scribe for Treatment Team: Harden Mo, LCSW 07/04/2018 1:54 PM

## 2018-07-04 NOTE — Discharge Summary (Signed)
Physician Discharge Summary Note  Patient:  Ricky Campbell is an 43 y.o., male MRN:  [id number] DOB:  11-26-1975 Patient phone:  [phone] (home)  Patient address:   [address] [place] [address],  Total Time spent with patient: 45 minutes  Date of Admission:  06/28/2018 Date of Discharge: July 04, 2018  Reason for Admission: Patient was admitted because of complaints about depression and substance abuse with irritability passive suicidal thoughts.  Principal Problem: Recurrent major depression-severe [facility]) Discharge Diagnoses: Principal Problem:   Recurrent major depression-severe ([place]) Active Problems:   Cannabis abuse, in remission   Cocaine abuse in remission ([place])   Opioid abuse, in remission [facility])   Past Psychiatric History: Has a past history of substance abuse issues behavior problems mood instability poor compliance  Past Medical History:  Past Medical History:  Diagnosis Date  . Bipolar disorder ([place])   . Chronic neck and back pain   . Chronic post-traumatic stress disorder (PTSD) 01/07/2016  . Hidradenitis   . Schizophrenia [facility])     Past Surgical History:  Procedure Laterality Date  . HAND SURGERY     Family History: History reviewed. No pertinent family history. Family Psychiatric  History: See previous Social History:  Social History   Substance and Sexual Activity  Alcohol Use No  . Frequency: Never     Social History   Substance and Sexual Activity  Drug Use Yes  . Frequency: 7.0 times per week  . Types: Benzodiazepines, Heroin, Cocaine, Marijuana   Comment: uses daily, heroin; last use 2 weeks ago    Social History   Socioeconomic History  . Marital status: Legally Separated    Spouse name: Not on file  . Number of children: Not on file  . Years of education: Not on file  . Highest education level: Not on file  Occupational History  . Not on file  Social Needs  . Financial resource strain: Not on file  . Food insecurity:     Worry: Not on file    Inability: Not on file  . Transportation needs:    Medical: Not on file    Non-medical: Not on file  Tobacco Use  . Smoking status: Current Every Day Smoker    Packs/day: 1.00    Years: 15.00    Pack years: 15.00  . Smokeless tobacco: Never Used  Substance and Sexual Activity  . Alcohol use: No    Frequency: Never  . Drug use: Yes    Frequency: 7.0 times per week    Types: Benzodiazepines, Heroin, Cocaine, Marijuana    Comment: uses daily, heroin; last use 2 weeks ago  . Sexual activity: Not on file  Lifestyle  . Physical activity:    Days per week: Not on file    Minutes per session: Not on file  . Stress: Not on file  Relationships  . Social connections:    Talks on phone: Not on file    Gets together: Not on file    Attends religious service: Not on file    Active member of club or organization: Not on file    Attends meetings of clubs or organizations: Not on file    Relationship status: Not on file  Other Topics Concern  . Not on file  Social History Narrative  . Not on file    Hospital Course: Patient admitted to the psychiatric ward.  BP monitoring for any detox which was unremarkable.  Maintained throughout his time here.  Patient's  mood was depressed and irritable but he did not engage in any violent or dangerous behavior.  He was started back on Seroquel Effexor and trazodone and showed good tolerance and response.  Started sleeping better.  Mood was calm and stable.  Denied suicidal ideation.  Participated in some groups.  Patient was interested in going to inpatient substance abuse rehab but has been declined by every program we have contacted in part because of limited availability during the virus crisis.  At this point he is stabilized and no longer has any symptoms that require inpatient hospitalization.  Patient is to be discharged from the hospital.  He has somewhat passive resistant behavior around discharge.  He says he is going to call  his father to be picked up this evening.  If that does not happen we will need to have him leave the hospital in the next day or so.  Strongly encouraged the patient to be thinking seriously about this but he is in decent enough physical shape that he should be stable with it.  Blood sugars have been stable.  Physical Findings: AIMS:  , ,  ,  ,    CIWA:    COWS:     Musculoskeletal: Strength & Muscle Tone: within normal limits Gait & Station: normal Patient leans: N/A  Psychiatric Specialty Exam: Physical Exam  Nursing note and vitals reviewed. Constitutional: He appears well-developed and well-nourished.  HENT:  Head: Normocephalic and atraumatic.  Eyes: Pupils are equal, round, and reactive to light. Conjunctivae are normal.  Neck: Normal range of motion.  Cardiovascular: Regular rhythm and normal heart sounds.  Respiratory: Effort normal. No respiratory distress.  GI: Soft.  Musculoskeletal: Normal range of motion.  Neurological: He is alert.  Skin: Skin is warm and dry.  Psychiatric: He has a normal mood and affect. His speech is normal and behavior is normal. Judgment and thought content normal. Cognition and memory are normal.    Review of Systems  Constitutional: Negative.   HENT: Negative.   Eyes: Negative.   Respiratory: Negative.   Cardiovascular: Negative.   Gastrointestinal: Negative.   Musculoskeletal: Negative.   Skin: Negative.   Neurological: Negative.   Psychiatric/Behavioral: Negative.     Blood pressure 116/85, pulse 73, temperature (!) 97.5 F (36.4 C), temperature source Oral, resp. rate 18, height 5' 11" (1.803 m), weight 83.5 kg, SpO2 100 %.Body mass index is 25.66 kg/m.  General Appearance: Fairly Groomed  Eye Contact:  Fair  Speech:  Clear and Coherent  Volume:  Normal  Mood:  Dysphoric  Affect:  Full Range  Thought Process:  Goal Directed  Orientation:  Full (Time, Place, and Person)  Thought Content:  Logical  Suicidal Thoughts:  No   Homicidal Thoughts:  No  Memory:  Immediate;   Fair Recent;   Fair Remote;   Fair  Judgement:  Fair  Insight:  Fair  Psychomotor Activity:  Normal  Concentration:  Concentration: Fair  Recall:  [employer] of Knowledge:  Fair  Language:  Fair  Akathisia:  No  Handed:  Right  AIMS (if indicated):     Assets:  Communication Skills Desire for Improvement Physical Health Resilience  ADL's:  Intact  Cognition:  WNL  Sleep:  Number of Hours: 7.5     Have you used any form of tobacco in the last 30 days? (Cigarettes, Smokeless Tobacco, Cigars, and/or Pipes): Yes  Has this patient used any form of tobacco in the last 30 days? (Cigarettes, Smokeless  Tobacco, Cigars, and/or Pipes) Yes, Yes, A prescription for an FDA-approved tobacco cessation medication was offered at discharge and the patient refused  Blood Alcohol level:  Lab Results  Component Value Date   [facility] <10 06/27/2018   ETH <10 12/45/8099    Metabolic Disorder Labs:  Lab Results  Component Value Date   HGBA1C 5.6 06/29/2018   MPG 114.02 06/29/2018   MPG 111 01/08/2016   No results found for: PROLACTIN Lab Results  Component Value Date   CHOL 275 (H) 06/29/2018   TRIG 178 (H) 06/29/2018   HDL 64 06/29/2018   CHOLHDL 4.3 06/29/2018   VLDL 36 06/29/2018   LDLCALC 175 (H) 06/29/2018   LDLCALC 167 (H) 01/08/2016    See Psychiatric Specialty Exam and Suicide Risk Assessment completed by Attending Physician prior to discharge.  Discharge destination:  Home  Is patient on multiple antipsychotic therapies at discharge:  No   Has Patient had three or more failed trials of antipsychotic monotherapy by history:  No  Recommended Plan for Multiple Antipsychotic Therapies: NA  Discharge Instructions    Diet - low sodium heart healthy   Complete by:  As directed    Increase activity slowly   Complete by:  As directed      Allergies as of 07/04/2018      Reactions   Tetracyclines & Related Swelling       Medication List    STOP taking these medications   lamoTRIgine 200 MG tablet Commonly known as:  LAMICTAL   naloxone 4 MG/0.1ML Liqd nasal spray kit Commonly known as:  NARCAN     TAKE these medications     Indication  gabapentin 300 MG capsule Commonly known as:  NEURONTIN Take 1 capsule (300 mg total) by mouth 2 (two) times daily.  Indication:  Abuse or Misuse of Alcohol   haloperidol 5 MG tablet Commonly known as:  HALDOL Take 1 tablet (5 mg total) by mouth 2 (two) times daily as needed for agitation. What changed:    when to take this  reasons to take this  Indication:  Problems with Behavior   hydrOXYzine 25 MG tablet Commonly known as:  ATARAX/VISTARIL Take 1 tablet (25 mg total) by mouth every 6 (six) hours as needed for anxiety.  Indication:  Feeling Anxious   QUEtiapine 200 MG tablet Commonly known as:  SEROQUEL Take 1 tablet (200 mg total) by mouth at bedtime.  Indication:  Depressive Phase of Manic-Depression   traZODone 100 MG tablet Commonly known as:  DESYREL Take 1 tablet (100 mg total) by mouth at bedtime as needed for sleep.  Indication:  Trouble Sleeping   venlafaxine XR 150 MG 24 hr capsule Commonly known as:  EFFEXOR-XR Take 1 capsule (150 mg total) by mouth daily with breakfast.  Indication:  Major Depressive Disorder      Follow-up Information    [facility] Follow up.   Why:  Please attend walk in hours if you should change your mind. Walk in hours are Monday, Wednesday adn Friday from 8am-4pm. Contact information: [place] [address] [phone]           Follow-up recommendations:  Activity:  Activity as tolerated Diet:  Diabetic diet Other:  Patient has been offered the option of going to a shelter in [facility] because they will take somebody without an ID.  We have offered to arrange for bus transportation there.  Patient has passively refused to cooperate saying  that is too far from  home for him.  His other options are limited.  If family cannot pick him up this evening he needs to be discharged from the hospital at the earliest opportunity.  Discharge orders written.  Follow-up can be arranged through [facility].  Prescriptions provided.  Comments: Patient no longer meets commitment criteria and no longer requires inpatient hospitalization and is and is to be discharged.  Signed: Alethia Berthold, MD 07/04/2018, 2:04 PM

## 2018-07-04 NOTE — Progress Notes (Signed)
I have asked several times if his dad was still coming to pick him up and he said "yes". I asked if they had a good relationship and he said "No, I haven't seen him in three years. He walked out after my mom died and got remarried six months later. He lives in [place]." Torrie Mayers RN

## 2018-07-04 NOTE — Plan of Care (Signed)
Pt denies hallucinations, SI and depression and anxiety is rated as a "5".  Torrie Mayers RN

## 2018-07-04 NOTE — BHH Group Notes (Signed)
Feelings Around Relapse 07/04/2018 1PM  Type of Therapy and Topic:  Group Therapy:  Feelings around Relapse and Recovery  Participation Level:  Did Not Attend   Description of Group:    Patients in this group will discuss emotions they experience before and after a relapse. They will process how experiencing these feelings, or avoidance of experiencing them, relates to having a relapse. Facilitator will guide patients to explore emotions they have related to recovery. Patients will be encouraged to process which emotions are more powerful. They will be guided to discuss the emotional reaction significant others in their lives may have to patients' relapse or recovery. Patients will be assisted in exploring ways to respond to the emotions of others without this contributing to a relapse.  Therapeutic Goals: 1. Patient will identify two or more emotions that lead to a relapse for them 2. Patient will identify two emotions that result when they relapse 3. Patient will identify two emotions related to recovery 4. Patient will demonstrate ability to communicate their needs through discussion and/or role plays   Summary of Patient Progress:     Therapeutic Modalities:   Cognitive Behavioral Therapy Solution-Focused Therapy Assertiveness Training Relapse Prevention Therapy   Suzan Slick, LCSW 07/04/2018 2:12 PM

## 2018-07-04 NOTE — Plan of Care (Signed)
  Problem: Education: Goal: Knowledge of [facility] Education information/materials will improve Outcome: Progressing Goal: Emotional status will improve Outcome: Progressing Goal: Mental status will improve Outcome: Progressing Goal: Verbalization of understanding the information provided will improve Outcome: Progressing   Problem: Coping: Goal: Coping ability will improve Outcome: Progressing Goal: Will verbalize feelings Outcome: Progressing   Problem: Self-Concept: Goal: Ability to identify factors that promote anxiety will improve Outcome: Progressing Goal: Level of anxiety will decrease Outcome: Progressing Goal: Ability to modify response to factors that promote anxiety will improve Outcome: Progressing

## 2018-07-04 NOTE — Progress Notes (Signed)
Recreation Therapy Notes  INPATIENT RECREATION TR PLAN  Patient Details Name: Ricky Campbell MRN: [id number] DOB: 1976-01-08 Today's Date: 07/04/2018  Rec Therapy Plan Is patient appropriate for Therapeutic Recreation?: Yes Treatment times per week: At least 3  Estimated Length of Stay: 5-7 days TR Treatment/Interventions: Group participation (Comment)  Discharge Criteria Pt will be discharged from therapy if:: Discharged Treatment plan/goals/alternatives discussed and agreed upon by:: Patient/family  Discharge Summary Short term goals set: Patient will identify 3 resources in their community to aid in recovery within 5 recreation therapy group sessions Short term goals met: Not met Progress toward goals comments: Groups attended Which groups?: Anger management Reason goals not met: Patient spent most of his time in his room Therapeutic equipment acquired: N/A Reason patient discharged from therapy: Discharge from hospital Pt/family agrees with progress & goals achieved: Yes Date patient discharged from therapy: 07/04/18   Tavon Corriher 07/04/2018, 11:41 AM

## 2018-07-04 NOTE — Progress Notes (Signed)
  [facility] Adult Case Management Discharge Plan :  Will you be returning to the same living situation after discharge:  Yes,  pt is homeless.  CSW provided information on [employer], however patient reports that he is not going.  At discharge, do you have transportation home?: Yes,  CSW provided patient with [facility] bus passes and schedule.  Do you have the ability to pay for your medications: Yes,  Medicare  Release of information consent forms completed and in the chart;  Patient's signature needed at discharge.  Patient to Follow up at: Follow-up Information    [facility] Follow up.   Why:  Please attend walk in hours if you should change your mind. Walk in hours are Monday, Wednesday adn Friday from 8am-4pm. Contact information: [address] [facility] [address] [phone]           Next level of care provider has access to [facility] Link:no  Safety Planning and Suicide Prevention discussed: No. SPE attempts were made, however, CSW was unable to reach the patient's family.   Have you used any form of tobacco in the last 30 days? (Cigarettes, Smokeless Tobacco, Cigars, and/or Pipes): Yes  Has patient been referred to the Quitline?: Patient refused referral  Patient has been referred for addiction treatment: Yes  Harden Mo, LCSW 07/04/2018, 11:21 AM

## 2018-07-04 NOTE — Progress Notes (Signed)
Patient is in day room visibly socializing throughout the evening, mood and affect remain bright , takes his medicine ,no side effects,  voice no concerns , appetite is good sleep is adequate, patient is responding well in the unite and maintaining safety of self and others , denies any SI/HI/AVH, education and support is provided , sleep is continuous with out any interruptions , 15 minutes safety checks is maintained no distress noted.

## 2018-07-05 NOTE — [facility] Group Notes (Signed)
LCSW Group Therapy Note   07/05/2018 1:15pm   Type of Therapy and Topic:  Group Therapy:  Trust and Honesty  Participation Level:  Did Not Attend  Description of Group:    In this group patients will be asked to explore the value of being honest.  Patients will be guided to discuss their thoughts, feelings, and behaviors related to honesty and trusting in others. Patients will process together how trust and honesty relate to forming relationships with peers, family members, and self. Each patient will be challenged to identify and express feelings of being vulnerable. Patients will discuss reasons why people are dishonest and identify alternative outcomes if one was truthful (to self or others). This group will be process-oriented, with patients participating in exploration of their own experiences, giving and receiving support, and processing challenge from other group members.   Therapeutic Goals: 1. Patient will identify why honesty is important to relationships and how honesty overall affects relationships.  2. Patient will identify a situation where they lied or were lied too and the  feelings, thought process, and behaviors surrounding the situation 3. Patient will identify the meaning of being vulnerable, how that feels, and how that correlates to being honest with self and others. 4. Patient will identify situations where they could have told the truth, but instead lied and explain reasons of dishonesty.   Summary of Patient Progress Pt was invited to attend group but chose not to attend. CSW will continue to encourage pt to attend group throughout their admission.     Therapeutic Modalities:   Cognitive Behavioral Therapy Solution Focused Therapy Motivational Interviewing Brief Therapy  Laurieanne Galloway  CUEBAS-COLON, LCSW 07/05/2018 12:38 PM  

## 2018-07-05 NOTE — Progress Notes (Signed)
Patient has been calm, responding appropriate and medication compliant, patient said that he has been trying to reach his father by phone and text massages regarding his discharge by Monday, concerned about a ride way home, encouraged patient with support and a transport voucher will be provided for him when the time comes for his discharge , patient denies suicidal and homicidal ideations , patient denies expressing hallucinations and delusions,   and  any physical complains. Patient denies any side effects from medications , appetite is good , consume 75% of meals and well hydrated with fluids and juices, sleep is good and only requiring 15 minutes safety checks for safety no distress noted.

## 2018-07-05 NOTE — Progress Notes (Signed)
[facility] MD Progress Note  07/05/2018 4:17 PM Ricky Campbell  MRN:  [id number] Subjective:  Patient seen.  Chart reviewed. Patient discussed with nursing; no overnight events reported.  Ricky Campbell is a 42yo CM with prior psychiatric diagnosis of bipolar disorder, PTSD and substance use disorder, who was admitted to inpatient psychiatry 7 days ago for medication management and stabilization secondary to depression and irritability.    Today patient was observed in the milleu, he interacted with others and did not cause any safety issues. He was cooperative with the interview.  He complaints of rasing thoughts and is asking about stimulant or Strattera for his attention and concentration. He denies feeling depressed or anxious. He denies experiencing any hallucinations and does not express any delusions. He denies any physical complaints. He reports chronic nightmares related to past traumatic events. He reports good appetite. He denies any side effects from medications. We discussed that the patient is stable and ready for discharge, patient agreed. He says that someone can pick him up only on Monday. We discuss that it would not be a good idea to start any new medications for ADHD prior to discharge and that we will defer ADHD-management to outpatient psychiatrist, patient agreed and said he has some Straterra at home to use first time after discharge.   Patient admitted to the psychiatric ward.  BP monitoring for any detox which was unremarkable.  Maintained throughout his time here.  Patient's mood was depressed and irritable but he did not engage in any violent or dangerous behavior.  He was started back on Seroquel Effexor and trazodone and showed good tolerance and response.  Started sleeping better.  Mood was calm and stable.  Denied suicidal ideation.  Participated in some groups.  Patient was interested in going to inpatient substance abuse rehab but has been declined by every program  we have contacted in part because of limited availability during the virus crisis.  At this point he is stabilized and no longer has any symptoms that require inpatient hospitalization.  Patient is to be discharged from the hospital.  He has somewhat passive resistant behavior around discharge.  He says he is going to call his father to be picked up this evening.  If that does not happen we will need to have him leave the hospital in the next day or so.  Strongly encouraged the patient to be thinking seriously about this but he is in decent enough physical shape that he should be stable with it.  Blood sugars have been stable.   Principal Problem: Recurrent major depression-severe (HCC) Diagnosis: Principal Problem:   Recurrent major depression-severe (HCC) Active Problems:   Cannabis abuse, in remission   Cocaine abuse in remission (HCC)   Opioid abuse, in remission (HCC)  Total Time spent with patient: 15 minutes  Past Psychiatric History: see admission H&P  Past Medical History:  Past Medical History:  Diagnosis Date  . Bipolar disorder (HCC)   . Chronic neck and back pain   . Chronic post-traumatic stress disorder (PTSD) 01/07/2016  . Hidradenitis   . Schizophrenia [facility])     Past Surgical History:  Procedure Laterality Date  . HAND SURGERY     Family History: History reviewed. No pertinent family history. Family Psychiatric  History: see admission H&P Social History:  Social History   Substance and Sexual Activity  Alcohol Use No  . Frequency: Never     Social History   Substance and Sexual Activity  Drug Use Yes  .  Frequency: 7.0 times per week  . Types: Benzodiazepines, Heroin, Cocaine, Marijuana   Comment: uses daily, heroin; last use 2 weeks ago    Social History   Socioeconomic History  . Marital status: Legally Separated    Spouse name: Not on file  . Number of children: Not on file  . Years of education: Not on file  . Highest education level: Not on  file  Occupational History  . Not on file  Social Needs  . Financial resource strain: Not on file  . Food insecurity:    Worry: Not on file    Inability: Not on file  . Transportation needs:    Medical: Not on file    Non-medical: Not on file  Tobacco Use  . Smoking status: Current Every Day Smoker    Packs/day: 1.00    Years: 15.00    Pack years: 15.00  . Smokeless tobacco: Never Used  Substance and Sexual Activity  . Alcohol use: No    Frequency: Never  . Drug use: Yes    Frequency: 7.0 times per week    Types: Benzodiazepines, Heroin, Cocaine, Marijuana    Comment: uses daily, heroin; last use 2 weeks ago  . Sexual activity: Not on file  Lifestyle  . Physical activity:    Days per week: Not on file    Minutes per session: Not on file  . Stress: Not on file  Relationships  . Social connections:    Talks on phone: Not on file    Gets together: Not on file    Attends religious service: Not on file    Active member of club or organization: Not on file    Attends meetings of clubs or organizations: Not on file    Relationship status: Not on file  Other Topics Concern  . Not on file  Social History Narrative  . Not on file   Additional Social History:                         Sleep: Good  Appetite:  Good  Current Medications: Current Facility-Administered Medications  Medication Dose Route Frequency Provider Last Rate Last Dose  . acetaminophen (TYLENOL) tablet 650 mg  650 mg Oral Q4H PRN Terance Hart, MD   650 mg at 07/03/18 1509  . alum & mag hydroxide-simeth (MAALOX/MYLANTA) 200-200-20 MG/5ML suspension 15 mL  15 mL Oral Q4H PRN Katheran Awe C, MD      . gabapentin (NEURONTIN) capsule 300 mg  300 mg Oral BID Darliss Ridgel, MD   300 mg at 07/05/18 0753  . haloperidol (HALDOL) tablet 5 mg  5 mg Oral BID PRN Terance Hart, MD      . hydrOXYzine (ATARAX/VISTARIL) tablet 25 mg  25 mg Oral Q6H PRN Terance Hart, MD   25 mg at  07/05/18 0753  . magnesium hydroxide (MILK OF MAGNESIA) suspension 30 mL  30 mL Oral Daily PRN Terance Hart, MD      . nicotine (NICODERM CQ - dosed in mg/24 hours) patch 14 mg  14 mg Transdermal Daily Darliss Ridgel, MD   14 mg at 07/05/18 0756  . QUEtiapine (SEROQUEL) tablet 200 mg  200 mg Oral QHS Clapacs, John T, MD   200 mg at 07/04/18 2202  . traZODone (DESYREL) tablet 100 mg  100 mg Oral QHS PRN Terance Hart, MD   100 mg at 07/04/18 2202  . venlafaxine [facility])  tablet 75 mg  75 mg Oral QPC breakfast Darliss Ridgel, MD   75 mg at 07/05/18 3491    Lab Results: No results found for this or any previous visit (from the past 48 hour(s)).  Blood Alcohol level:  Lab Results  Component Value Date   ETH <10 06/27/2018   ETH <10 06/29/2017    Metabolic Disorder Labs: Lab Results  Component Value Date   HGBA1C 5.6 06/29/2018   MPG 114.02 06/29/2018   MPG 111 01/08/2016   No results found for: PROLACTIN Lab Results  Component Value Date   CHOL 275 (H) 06/29/2018   TRIG 178 (H) 06/29/2018   HDL 64 06/29/2018   CHOLHDL 4.3 06/29/2018   VLDL 36 06/29/2018   LDLCALC 175 (H) 06/29/2018   LDLCALC 167 (H) 01/08/2016    Physical Findings: AIMS:  , ,  ,  ,    CIWA:    COWS:     Musculoskeletal: Strength & Muscle Tone: within normal limits Gait & Station: normal Patient leans: N/A  Psychiatric Specialty Exam: Physical Exam  ROS  Blood pressure 108/83, pulse 70, temperature (!) 97.5 F (36.4 C), temperature source Oral, resp. rate 16, height 5\' 11"  (1.803 m), weight 83.5 kg, SpO2 98 %.Body mass index is 25.66 kg/m.  General Appearance: Casual and Fairly Groomed  Eye Contact:  Good  Speech:  Normal Rate  Volume:  Normal  Mood:  Euthymic  Affect:  Appropriate  Thought Process:  Coherent and Linear  Orientation:  Full (Time, Place, and Person)  Thought Content:  Logical  Suicidal Thoughts:  No  Homicidal Thoughts:  No  Memory:  Immediate;   Good Recent;    Good Remote;   Good  Judgement:  Good  Insight:  Good  Psychomotor Activity:  Normal  Concentration:  Concentration: Fair  Recall:  Fair  Fund of Knowledge:  Fair  Language:  Good  Akathisia:  No  Handed:  Left  AIMS (if indicated):     Assets:  Desire for Improvement Housing  ADL's:  Intact  Cognition:  WNL  Sleep:  Number of Hours: 7.75     Treatment Plan Summary: Daily contact with patient to assess and evaluate symptoms and progress in treatment   Ricky Campbell is a 42yo CM with prior psychiatric diagnosis of bipolar disorder, PTSD and substance use disorder, who was admitted to inpatient psychiatry 7 days ago for medication management and stabilization secondary to depression and irritability.    Today patient does not reports any new complaints. He denies feeling depressed, suicidal, homicidal. He is calm and visible in the milieu. He is ready for discharge. No medication changes today.  Impression:  Bipolar disorder PTSD Substance use disorder  Plan: -patient is ready for discharge.  -continue current psych medications: Seroquel 200mg  PO QHS and Effexor ER 75mg  PO daily.   Thalia Party, MD 07/05/2018, 4:17 PM

## 2018-07-05 NOTE — Progress Notes (Signed)
D: Pt during assessments denies SI/HI/AVH, able to contract for safety. Pt is pleasant and cooperative. Pt. has no Complaints for the most part, but does not want to discharge. Pt. Discharge has been delayed. Pt. Denies pain. Pt. Endorses a mostly normal mood, but some anxiety present, given PRN medication for this.   A: Q x 15 minute observation checks to be completed for safety. Patient was provided with education.  Patient was given/offered medications per orders. Patient  was encourage to attend groups, participate in unit activities and continue with plan of care. Pt. Chart and plans of care reviewed. Pt. Given support and encouragement.   R: Patient is complaint with medications and unit procedures. Pt. Eating good. Pt. Mostly isolative and withdrawn to room resting.

## 2018-07-05 NOTE — Progress Notes (Signed)
Patient at shift change requested to use cell phone to call his father due to pending discharge patient reports being unable to reach anybody by phone. Cooperative with treatment, in bed resting at this time.

## 2018-07-05 NOTE — Plan of Care (Signed)
Pt. Is complaint with medications. Pt. Denies si/hi/avh, able to contract for safety.    Problem: Health Behavior/Discharge Planning: Goal: Compliance with treatment plan for underlying cause of condition will improve Outcome: Progressing   Problem: Safety: Goal: Periods of time without injury will increase Outcome: Progressing

## 2018-07-06 NOTE — Plan of Care (Signed)
  Problem: Education: Goal: Knowledge of [facility] Education information/materials will improve Outcome: Progressing Goal: Emotional status will improve Outcome: Progressing Goal: Mental status will improve Outcome: Progressing Goal: Verbalization of understanding the information provided will improve Outcome: Progressing   Problem: Activity: Goal: Interest or engagement in activities will improve Outcome: Progressing Goal: Sleeping patterns will improve Outcome: Progressing   Problem: Coping: Goal: Ability to verbalize frustrations and anger appropriately will improve Outcome: Progressing Goal: Ability to demonstrate self-control will improve Outcome: Progressing   Problem: Health Behavior/Discharge Planning: Goal: Identification of resources available to assist in meeting health care needs will improve Outcome: Progressing Goal: Compliance with treatment plan for underlying cause of condition will improve Outcome: Progressing   Problem: Physical Regulation: Goal: Ability to maintain clinical measurements within normal limits will improve Outcome: Progressing   Problem: Safety: Goal: Periods of time without injury will increase Outcome: Progressing   

## 2018-07-06 NOTE — Progress Notes (Signed)
Patient is seeing in the milieu socializing  with peers calm, with out any out burst or violent behaviors, patient said that he is worried about his ride tomorrow after discharge, voice concern about who is coming to pick him up and take him home tomorrow, concern noted, reassured patient that a ride will be available for him. Patient  Denies any SI/HI/AVH , compliant with his medication regimen, no side effects, patient is showing much improvement, denies anxiety and depressions , contract for safety of self and others, patient is safe in the unit , sleep is continuous without interruptions only requiring 15 minutes safety rounding for safety no distress noted.

## 2018-07-06 NOTE — Plan of Care (Signed)
D- Patient alert and oriented. Patient presents in a pleasant mood on assessment stating that he slept ok last night "I woke up about 5am". Patient continues to endorse right rib cage pain, rating it a "7/10", however, he did not request any pain medication from this Clinical research associate. Patient rated his depression/anxiety a "5/10" stating that he is "tired of stuff", in which he did request anxiety medication. Patient denies SI, HI, AVH, at this time. Patient had no stated goals for today.   A- Scheduled medications administered to patient, per MD orders. Support and encouragement provided.  Routine safety checks conducted every 15 minutes.  Patient informed to notify staff with problems or concerns.  R- No adverse drug reactions noted. Patient contracts for safety at this time. Patient compliant with medications and treatment plan. Patient receptive, calm, and cooperative. Patient interacts well with others on the unit.  Patient remains safe at this time.   Problem: Education: Goal: Knowledge of [facility] Education information/materials will improve Outcome: Progressing Goal: Emotional status will improve Outcome: Progressing Goal: Mental status will improve Outcome: Progressing Goal: Verbalization of understanding the information provided will improve Outcome: Progressing   Problem: Activity: Goal: Interest or engagement in activities will improve Outcome: Progressing Goal: Sleeping patterns will improve Outcome: Progressing   Problem: Coping: Goal: Ability to verbalize frustrations and anger appropriately will improve Outcome: Progressing Goal: Ability to demonstrate self-control will improve Outcome: Progressing   Problem: Health Behavior/Discharge Planning: Goal: Identification of resources available to assist in meeting health care needs will improve Outcome: Progressing Goal: Compliance with treatment plan for underlying cause of condition will improve Outcome: Progressing    Problem: Physical Regulation: Goal: Ability to maintain clinical measurements within normal limits will improve Outcome: Progressing   Problem: Safety: Goal: Periods of time without injury will increase Outcome: Progressing

## 2018-07-06 NOTE — Progress Notes (Signed)
[facility] MD Progress Note  07/06/2018 10:36 AM Ricky Campbell  MRN:  [id number] Subjective:  Patient seen.  Chart reviewed. Patient discussed with nursing; no overnight events reported.  Ricky Campbell is a 42yo CM with prior psychiatric diagnosis of bipolar disorder, PTSD and substance use disorder, who was admitted to inpatient psychiatry a week ago for medication management and stabilization secondary to depression and irritability.    Today patient was observed in the milleu, he interacted with others and did not cause any safety issues. He was cooperative with the interview. Patient does not report any complaints. He reports stable mood, not depressed, not suicidal, not homicidal, denies hallucinations, does not express delusions. He reports chronic nightmares related to past trauma. Reports some vivid dreams he related to current psych medications.   Patient is stable and ready for discharge. He says that his father can pick him up tomorrow.   Principal Problem: Recurrent major depression-severe (HCC) Diagnosis: Principal Problem:   Recurrent major depression-severe (HCC) Active Problems:   Cannabis abuse, in remission   Cocaine abuse in remission (HCC)   Opioid abuse, in remission (HCC)  Total Time spent with patient: 15 minutes  Past Psychiatric History: see admission H&P  Past Medical History:  Past Medical History:  Diagnosis Date  . Bipolar disorder (HCC)   . Chronic neck and back pain   . Chronic post-traumatic stress disorder (PTSD) 01/07/2016  . Hidradenitis   . Schizophrenia [facility])     Past Surgical History:  Procedure Laterality Date  . HAND SURGERY     Family History: History reviewed. No pertinent family history. Family Psychiatric  History: see admission H&P Social History:  Social History   Substance and Sexual Activity  Alcohol Use No  . Frequency: Never     Social History   Substance and Sexual Activity  Drug Use Yes  . Frequency: 7.0  times per week  . Types: Benzodiazepines, Heroin, Cocaine, Marijuana   Comment: uses daily, heroin; last use 2 weeks ago    Social History   Socioeconomic History  . Marital status: Legally Separated    Spouse name: Not on file  . Number of children: Not on file  . Years of education: Not on file  . Highest education level: Not on file  Occupational History  . Not on file  Social Needs  . Financial resource strain: Not on file  . Food insecurity:    Worry: Not on file    Inability: Not on file  . Transportation needs:    Medical: Not on file    Non-medical: Not on file  Tobacco Use  . Smoking status: Current Every Day Smoker    Packs/day: 1.00    Years: 15.00    Pack years: 15.00  . Smokeless tobacco: Never Used  Substance and Sexual Activity  . Alcohol use: No    Frequency: Never  . Drug use: Yes    Frequency: 7.0 times per week    Types: Benzodiazepines, Heroin, Cocaine, Marijuana    Comment: uses daily, heroin; last use 2 weeks ago  . Sexual activity: Not on file  Lifestyle  . Physical activity:    Days per week: Not on file    Minutes per session: Not on file  . Stress: Not on file  Relationships  . Social connections:    Talks on phone: Not on file    Gets together: Not on file    Attends religious service: Not on file    Active  member of club or organization: Not on file    Attends meetings of clubs or organizations: Not on file    Relationship status: Not on file  Other Topics Concern  . Not on file  Social History Narrative  . Not on file   Additional Social History:                         Sleep: Good  Appetite:  Good  Current Medications: Current Facility-Administered Medications  Medication Dose Route Frequency Provider Last Rate Last Dose  . acetaminophen (TYLENOL) tablet 650 mg  650 mg Oral Q4H PRN Terance Hart, MD   650 mg at 07/03/18 1509  . alum & mag hydroxide-simeth (MAALOX/MYLANTA) 200-200-20 MG/5ML suspension 15 mL   15 mL Oral Q4H PRN Katheran Awe C, MD      . gabapentin (NEURONTIN) capsule 300 mg  300 mg Oral BID Darliss Ridgel, MD   300 mg at 07/06/18 4193  . haloperidol (HALDOL) tablet 5 mg  5 mg Oral BID PRN Terance Hart, MD      . hydrOXYzine (ATARAX/VISTARIL) tablet 25 mg  25 mg Oral Q6H PRN Terance Hart, MD   25 mg at 07/06/18 7902  . magnesium hydroxide (MILK OF MAGNESIA) suspension 30 mL  30 mL Oral Daily PRN Terance Hart, MD      . nicotine (NICODERM CQ - dosed in mg/24 hours) patch 14 mg  14 mg Transdermal Daily Darliss Ridgel, MD   14 mg at 07/06/18 4097  . QUEtiapine (SEROQUEL) tablet 200 mg  200 mg Oral QHS Clapacs, John T, MD   200 mg at 07/05/18 2058  . traZODone (DESYREL) tablet 100 mg  100 mg Oral QHS PRN Terance Hart, MD   100 mg at 07/05/18 2059  . venlafaxine (EFFEXOR) tablet 75 mg  75 mg Oral QPC breakfast Darliss Ridgel, MD   75 mg at 07/06/18 3532    Lab Results: No results found for this or any previous visit (from the past 48 hour(s)).  Blood Alcohol level:  Lab Results  Component Value Date   ETH <10 06/27/2018   ETH <10 06/29/2017    Metabolic Disorder Labs: Lab Results  Component Value Date   HGBA1C 5.6 06/29/2018   MPG 114.02 06/29/2018   MPG 111 01/08/2016   No results found for: PROLACTIN Lab Results  Component Value Date   CHOL 275 (H) 06/29/2018   TRIG 178 (H) 06/29/2018   HDL 64 06/29/2018   CHOLHDL 4.3 06/29/2018   VLDL 36 06/29/2018   LDLCALC 175 (H) 06/29/2018   LDLCALC 167 (H) 01/08/2016    Physical Findings: AIMS:  , ,  ,  ,    CIWA:    COWS:     Musculoskeletal: Strength & Muscle Tone: within normal limits Gait & Station: normal Patient leans: N/A  Psychiatric Specialty Exam: Physical Exam  ROS  Blood pressure 104/67, pulse 79, temperature (!) 97.5 F (36.4 C), temperature source Oral, resp. rate 18, height 5\' 11"  (1.803 m), weight 83.5 kg, SpO2 99 %.Body mass index is 25.66 kg/m.  General Appearance:  Negative and Casual  Eye Contact:  Good  Speech:  Normal Rate  Volume:  Normal  Mood:  Euthymic  Affect:  Constricted  Thought Process:  Coherent, Goal Directed and Linear  Orientation:  Full (Time, Place, and Person)  Thought Content:  Logical  Suicidal Thoughts:  No  Homicidal Thoughts:  No  Memory:  Immediate;   Fair Recent;   Fair Remote;   Fair  Judgement:  Good  Insight:  Good  Psychomotor Activity:  Normal  Concentration:  Concentration: Fair and Attention Span: Fair  Recall:  [employer] of Knowledge:  Fair  Language:  Good  Akathisia:  No  Handed:  Right  AIMS (if indicated):     Assets:  Desire for Improvement Housing  ADL's:  Intact  Cognition:  WNL  Sleep:  Number of Hours: 7     Treatment Plan Summary:  Ricky Campbell is a 42yo CM with prior psychiatric diagnosis of bipolar disorder, PTSD and substance use disorder, who was admitted to inpatient psychiatry a week ago for medication management and stabilization secondary to depression and irritability.    Today patient does not reports any new complaints. He denies feeling depressed, suicidal, homicidal. He is calm and visible in the milieu. He is ready for discharge. No medication changes today.  Impression:  Bipolar disorder PTSD Substance use disorder  Plan: -patient is ready for discharge.  -continue current psych medications: Seroquel 200mg  PO QHS and Effexor ER 75mg  PO daily.  -Dispo: D/C tomorrow, om 07/07/2018.  Thalia Party, MD 07/06/2018, 10:36 AM

## 2018-07-06 NOTE — BHH Group Notes (Signed)
LCSW Group Therapy Note 07/06/2018 1:15pm  Type of Therapy and Topic: Group Therapy: Feelings Around Returning Home & Establishing a Supportive Framework and Supporting Oneself When Supports Not Available  Participation Level: Did Not Attend  Description of Group:  Patients first processed thoughts and feelings about upcoming discharge. These included fears of upcoming changes, lack of change, new living environments, judgements and expectations from others and overall stigma of mental health issues. The group then discussed the definition of a supportive framework, what that looks and feels like, and how do to discern it from an unhealthy non-supportive network. The group identified different types of supports as well as what to do when your family/friends are less than helpful or unavailable  Therapeutic Goals  1. Patient will identify one healthy supportive network that they can use at discharge. 2. Patient will identify one factor of a supportive framework and how to tell it from an unhealthy network. 3. Patient able to identify one coping skill to use when they do not have positive supports from others. 4. Patient will demonstrate ability to communicate their needs through discussion and/or role plays.  Summary of Patient Progress:  Pt was invited to attend group but chose not to attend. CSW will continue to encourage pt to attend group throughout their admission.   Therapeutic Modalities Cognitive Behavioral Therapy Motivational Interviewing   Ricky Campbell  CUEBAS-COLON, LCSW 07/06/2018 8:19 AM

## 2018-07-07 NOTE — BHH Group Notes (Signed)
LCSW Group Therapy Note   07/07/2018 1:00 PM  Type of Therapy and Topic:  Group Therapy:  Overcoming Obstacles   Participation Level:  Did Not Attend   Description of Group:    In this group patients will be encouraged to explore what they see as obstacles to their own wellness and recovery. They will be guided to discuss their thoughts, feelings, and behaviors related to these obstacles. The group will process together ways to cope with barriers, with attention given to specific choices patients can make. Each patient will be challenged to identify changes they are motivated to make in order to overcome their obstacles. This group will be process-oriented, with patients participating in exploration of their own experiences as well as giving and receiving support and challenge from other group members.   Therapeutic Goals: 1. Patient will identify personal and current obstacles as they relate to admission. 2. Patient will identify barriers that currently interfere with their wellness or overcoming obstacles.  3. Patient will identify feelings, thought process and behaviors related to these barriers. 4. Patient will identify two changes they are willing to make to overcome these obstacles:      Summary of Patient Progress   X    Therapeutic Modalities:   Cognitive Behavioral Therapy Solution Focused Therapy Motivational Interviewing Relapse Prevention Therapy  Penni Homans, MSW, LCSW 07/07/2018 12:52 PM

## 2018-07-07 NOTE — BHH Counselor (Signed)
CSW called several shelters in [place] and [address] in attempt to identify housing for the patient.  Calls were not answered and messages were not returned.    Penni Homans, MSW, LCSW 07/07/2018 10:27 AM

## 2018-07-07 NOTE — BHH Counselor (Signed)
CSW attempted to call the patient's father, however was unable to speak with him and left a HIPAA compliant voicemail.   Penni Homans, MSW, LCSW 07/07/2018 8:56 AM

## 2018-07-07 NOTE — Progress Notes (Addendum)
  [facility] Adult Case Management Discharge Plan :  Will you be returning to the same living situation after discharge:  Yes,  pt is homeless.   At discharge, do you have transportation home?: Yes,  CSW will provide pt with a cab voucher.  Pt has indicated that he wants to go to a motel in [place], [place]. Do you have the ability to pay for your medications: Yes,  Medicare  Release of information consent forms completed and in the chart;  Patient's signature needed at discharge.  Patient to Follow up at: Follow-up Information    [facility] Follow up.   Why:  Please attend walk in hours if you should change your mind. Walk in hours are Monday, Wednesday adn Friday from 8am-4pm. Contact information: [address] [facility] [address] [phone]           Next level of care provider has access to [facility] Link:no  Safety Planning and Suicide Prevention discussed: No.  CSW attempted to contact the patient's father, however, was not able to speak with him.   Have you used any form of tobacco in the last 30 days? (Cigarettes, Smokeless Tobacco, Cigars, and/or Pipes): Yes  Has patient been referred to the Quitline?: Patient refused referral  Patient has been referred for addiction treatment: Yes  Harden Mo, LCSW 07/07/2018, 9:01 AM

## 2018-07-07 NOTE — Progress Notes (Signed)
Recreation Therapy Notes   Date: 07/07/2018  Time: 9:30 am   Location: Craft room   Behavioral response: N/A   Intervention Topic: Goals  Discussion/Intervention: Patient did not attend group.   Clinical Observations/Feedback:  Patient did not attend group.   Sulo Janczak LRT/CTRS        Monchel Pollitt 07/07/2018 10:56 AM 

## 2018-07-07 NOTE — Progress Notes (Signed)
Discharge Note:  Patient denies SI/HI/AVH at this time. Discharge instructions, AVS, prescriptions, and transition record gone over with patient. Patient agrees to comply with medication management, follow-up visit, and outpatient therapy. Patient belongings returned to patient. Patient questions and concerns addressed and answered. Patient ambulatory off unit. Patient discharged to a hotel via [employer] services.

## 2018-10-23 ENCOUNTER — Other Ambulatory Visit: Payer: Self-pay | Admitting: Psychiatry

## 2019-01-12 ENCOUNTER — Other Ambulatory Visit: Payer: Self-pay | Admitting: Psychiatry

## 2021-12-19 ENCOUNTER — Emergency Department
Admission: EM | Admit: 2021-12-19 | Discharge: 2021-12-20 | Disposition: A | Discharge status: Home / Self Care | Payer: Medicare PPO | Attending: Emergency Medicine | Admitting: Emergency Medicine

## 2021-12-19 DIAGNOSIS — F3130 Bipolar disorder, current episode depressed, mild or moderate severity, unspecified: Secondary | ICD-10-CM | POA: Diagnosis not present

## 2021-12-19 DIAGNOSIS — F1220 Cannabis dependence, uncomplicated: Secondary | ICD-10-CM | POA: Insufficient documentation

## 2021-12-19 DIAGNOSIS — Z20822 Contact with and (suspected) exposure to covid-19: Secondary | ICD-10-CM | POA: Diagnosis not present

## 2021-12-19 DIAGNOSIS — F12188 Cannabis abuse with other cannabis-induced disorder: Secondary | ICD-10-CM

## 2021-12-19 DIAGNOSIS — F4312 Post-traumatic stress disorder, chronic: Secondary | ICD-10-CM | POA: Diagnosis not present

## 2021-12-19 DIAGNOSIS — F1123 Opioid dependence with withdrawal: Secondary | ICD-10-CM | POA: Diagnosis not present

## 2021-12-19 DIAGNOSIS — F1411 Cocaine abuse, in remission: Secondary | ICD-10-CM | POA: Diagnosis present

## 2021-12-19 DIAGNOSIS — R112 Nausea with vomiting, unspecified: Secondary | ICD-10-CM | POA: Diagnosis present

## 2021-12-19 DIAGNOSIS — F332 Major depressive disorder, recurrent severe without psychotic features: Secondary | ICD-10-CM | POA: Diagnosis present

## 2021-12-19 DIAGNOSIS — F17200 Nicotine dependence, unspecified, uncomplicated: Secondary | ICD-10-CM | POA: Diagnosis not present

## 2021-12-19 DIAGNOSIS — F1193 Opioid use, unspecified with withdrawal: Secondary | ICD-10-CM

## 2021-12-19 DIAGNOSIS — F1410 Cocaine abuse, uncomplicated: Secondary | ICD-10-CM | POA: Insufficient documentation

## 2021-12-19 DIAGNOSIS — F1120 Opioid dependence, uncomplicated: Secondary | ICD-10-CM | POA: Diagnosis present

## 2021-12-19 LAB — CBC WITH DIFFERENTIAL/PLATELET
Abs Immature Granulocytes: 0.03 10*3/uL (ref 0.00–0.07)
Basophils Absolute: 0.1 10*3/uL (ref 0.0–0.1)
Basophils Relative: 1 %
Eosinophils Absolute: 0.1 10*3/uL (ref 0.0–0.5)
Eosinophils Relative: 1 %
HCT: 42.8 % (ref 39.0–52.0)
Hemoglobin: 13.7 g/dL (ref 13.0–17.0)
Immature Granulocytes: 0 %
Lymphocytes Relative: 27 %
Lymphs Abs: 3.3 10*3/uL (ref 0.7–4.0)
MCH: 28.4 pg (ref 26.0–34.0)
MCHC: 32 g/dL (ref 30.0–36.0)
MCV: 88.8 fL (ref 80.0–100.0)
Monocytes Absolute: 0.9 10*3/uL (ref 0.1–1.0)
Monocytes Relative: 7 %
Neutro Abs: 8.2 10*3/uL — ABNORMAL HIGH (ref 1.7–7.7)
Neutrophils Relative %: 64 %
Platelets: 282 10*3/uL (ref 150–400)
RBC: 4.82 MIL/uL (ref 4.22–5.81)
RDW: 13.4 % (ref 11.5–15.5)
WBC: 12.5 10*3/uL — ABNORMAL HIGH (ref 4.0–10.5)
nRBC: 0 % (ref 0.0–0.2)

## 2021-12-19 MED ORDER — ONDANSETRON HCL 4 MG/2ML IJ SOLN
4.0000 mg | Freq: Once | INTRAMUSCULAR | Status: AC
  Administered 2021-12-19: 4 mg via INTRAVENOUS
  Filled 2021-12-19: qty 2

## 2021-12-19 MED ORDER — DROPERIDOL 2.5 MG/ML IJ SOLN
2.5000 mg | Freq: Once | INTRAMUSCULAR | Status: AC
  Administered 2021-12-19: 2.5 mg via INTRAVENOUS
  Filled 2021-12-19: qty 2

## 2021-12-19 MED ORDER — BUPRENORPHINE HCL 8 MG SL SUBL
8.0000 mg | SUBLINGUAL_TABLET | Freq: Once | SUBLINGUAL | Status: AC
  Administered 2021-12-19: 8 mg via SUBLINGUAL
  Filled 2021-12-19: qty 1

## 2021-12-19 MED ORDER — TRAZODONE HCL 100 MG PO TABS
100.0000 mg | ORAL_TABLET | Freq: Every day | ORAL | Status: DC
  Administered 2021-12-19: 100 mg via ORAL
  Filled 2021-12-19: qty 1

## 2021-12-19 MED ORDER — PRAZOSIN HCL 5 MG PO CAPS
5.0000 mg | ORAL_CAPSULE | Freq: Every day | ORAL | Status: DC
  Administered 2021-12-19: 5 mg via ORAL
  Filled 2021-12-19: qty 1

## 2021-12-19 MED ORDER — SODIUM CHLORIDE 0.9 % IV BOLUS
1000.0000 mL | Freq: Once | INTRAVENOUS | Status: AC
  Administered 2021-12-19: 1000 mL via INTRAVENOUS

## 2021-12-19 NOTE — ED Provider Notes (Addendum)
[facility] Provider Note    Event Date/Time   First MD Initiated Contact with Patient 12/19/21 2144     (approximate)   History   Withdrawal (Patient states that he recently moved here from [place] and has not had his daily medications in 2 weeks (Cymbalta 90 mg daily, Prazosin 5 mg daily, Adderall 15 mg BID, Trazodone 100 mg qhs) not his Methadone 155 mg daily in 3 days; Reports generalized pain 9/10 and nausea/vomiting)   HPI  Ricky Campbell is a 46 y.o. male has medical history of bipolar disorder, PTSD, opiate use disorder who presents with withdrawal.  Patient moved from [place] and has been off of all his medication for the last 2 weeks.  Was on the methadone clinic getting 155 mg daily.  Tells me he has not had the methadone for over 2 weeks.  Initially stopped going to the methadone clinic in [place] and then has not been established here as of yet.  Started feeling sick about 3 days ago significant nausea vomiting body aches.  Also with generalized abdominal pain.  Says he wants to kill himself if he leaves here he will either overdose or hurt himself.    Past Medical History:  Diagnosis Date   Bipolar disorder (HCC)    Chronic neck and back pain    Chronic post-traumatic stress disorder (PTSD) 01/07/2016   Hidradenitis    Schizophrenia (HCC)     Patient Active Problem List   Diagnosis Date Noted   Recurrent major depression-severe (HCC) 06/28/2018   Cannabis abuse, in remission    Cocaine abuse in remission [facility])    Opioid abuse, in remission (HCC)    Bipolar I disorder, most recent episode depressed (HCC) 01/07/2016   Back pain 01/07/2016   Hydradenitis 01/07/2016   Cannabis use disorder, moderate, dependence (HCC) 01/07/2016   Opioid use disorder, moderate, dependence (HCC) 01/07/2016   Tobacco use disorder 01/07/2016   Chronic post-traumatic stress disorder (PTSD) 01/07/2016     Physical Exam  Triage Vital Signs: ED Triage  Vitals  Enc Vitals Group     BP 12/19/21 1637 (!) 142/94     Pulse Rate 12/19/21 1637 (!) 107     Resp 12/19/21 1637 20     Temp 12/19/21 1637 98.6 F (37 C)     Temp Source 12/19/21 1637 Oral     SpO2 12/19/21 1637 98 %     Weight 12/19/21 1637 200 lb (90.7 kg)     Height 12/19/21 1637 5\' 11"  (1.803 m)     Head Circumference --      Peak Flow --      Pain Score 12/19/21 1641 9     Pain Loc --      Pain Edu? --      Excl. in GC? --     Most recent vital signs: Vitals:   12/19/21 1637 12/19/21 2214  BP: (!) 142/94 (!) 132/93  Pulse: (!) 107 87  Resp: 20 18  Temp: 98.6 F (37 C) 98.3 F (36.8 C)  SpO2: 98% 99%     General: Awake, patient is retching CV:  Good peripheral perfusion.  Resp:  Normal effort.  Abd:  No distention.  Abdomen is soft Neuro:             Awake, Alert, Oriented x 3  Other:     ED Results / Procedures / Treatments  Labs (all labs ordered are listed, but only abnormal results are  displayed) Labs Reviewed  RESP PANEL BY RT-PCR (FLU A&B, COVID) [id number]  COMPREHENSIVE METABOLIC PANEL  CBC WITH DIFFERENTIAL/PLATELET  URINE DRUG SCREEN, QUALITATIVE ([facility] ONLY)     EKG     RADIOLOGY    PROCEDURES:  Critical Care performed: No  Procedures    MEDICATIONS ORDERED IN ED: Medications  traZODone (DESYREL) tablet 100 mg (has no administration in time range)  prazosin (MINIPRESS) capsule 5 mg (has no administration in time range)  buprenorphine (SUBUTEX) sublingual tablet 8 mg (has no administration in time range)  sodium chloride 0.9 % bolus 1,000 mL (1,000 mLs Intravenous New Bag/Given 12/19/21 2213)  ondansetron (ZOFRAN) injection 4 mg (4 mg Intravenous Given 12/19/21 2212)     IMPRESSION / MDM / ASSESSMENT AND PLAN / ED COURSE  I reviewed the triage vital signs and the nursing notes.                              Patient's presentation is most consistent with acute complicated illness / injury requiring diagnostic  workup.  Differential diagnosis includes, but is not limited to, opiate withdrawal, gastroenteritis, hypovolemia  Patient is a 46 year old male with history of opiate use disorder bipolar disorder who presents with concern for withdrawal.  He has been out of all of his medications for the last 2 weeks including the methadone.  Started feeling sick about 3 days ago.  Feels like he is in withdrawal says he has abnormal odor in his nose which is typical of withdrawal and has had nausea vomiting abdominal pain and body aches.  He is tachycardic mildly hypertensive.  Actively retching on exam.  Somewhat diaphoretic.  Abdomen soft nontender.  Suspect that opiate withdrawal is contributing to the majority of his symptoms.  Will place IV give Zofran.  I have ordered his trazodone and prazosin.  He is somewhat unsure of his clonidine dose we will hold off on ordering that.  I have also ordered Subutex as he says that he cannot be on Suboxone as a sepsis stomach with has had Subutex in the past.  Patient saying he is suicidal I suspect this is more related to him acutely feeling unwell but we will have psychiatry see.    Patient seen by psychiatry they recommend keeping him overnight reassessing him.  Planning to reach out to pharmacy to confirm what medications he was on.   FINAL CLINICAL IMPRESSION(S) / ED DIAGNOSES   Final diagnoses:  Opioid use with withdrawal (HCC)     Rx / DC Orders   ED Discharge Orders     None        Note:  This document was prepared using Dragon voice recognition software and may include unintentional dictation errors.   Georga Hacking, MD 12/19/21 2225    Georga Hacking, MD 12/19/21 2252

## 2021-12-19 NOTE — ED Triage Notes (Signed)
Patient states that he recently moved here from [place] and has not had his daily medications in 2 weeks (Cymbalta 90 mg daily, Prazosin 5 mg daily, Adderall 15 mg BID, Trazodone 100 mg qhs) not his Methadone 155 mg daily in 3 days; Reports generalized pain 9/10 and nausea/vomiting

## 2021-12-20 DIAGNOSIS — F1193 Opioid use, unspecified with withdrawal: Secondary | ICD-10-CM

## 2021-12-20 LAB — RESP PANEL BY RT-PCR (FLU A&B, COVID) ARPGX2
Influenza A by PCR: NEGATIVE
Influenza B by PCR: NEGATIVE
SARS Coronavirus 2 by RT PCR: NEGATIVE

## 2021-12-20 LAB — URINE DRUG SCREEN, QUALITATIVE (ARMC ONLY)
Amphetamines, Ur Screen: POSITIVE — AB
Barbiturates, Ur Screen: POSITIVE — AB
Benzodiazepine, Ur Scrn: NOT DETECTED
Cannabinoid 50 Ng, Ur ~~LOC~~: POSITIVE — AB
Cocaine Metabolite,Ur ~~LOC~~: NOT DETECTED
MDMA (Ecstasy)Ur Screen: NOT DETECTED
Methadone Scn, Ur: POSITIVE — AB
Opiate, Ur Screen: NOT DETECTED
Phencyclidine (PCP) Ur S: NOT DETECTED
Tricyclic, Ur Screen: NOT DETECTED

## 2021-12-20 LAB — COMPREHENSIVE METABOLIC PANEL
ALT: 12 U/L (ref 0–44)
AST: 15 U/L (ref 15–41)
Albumin: 3.7 g/dL (ref 3.5–5.0)
Alkaline Phosphatase: 70 U/L (ref 38–126)
Anion gap: 9 (ref 5–15)
BUN: 7 mg/dL (ref 6–20)
CO2: 24 mmol/L (ref 22–32)
Calcium: 8.2 mg/dL — ABNORMAL LOW (ref 8.9–10.3)
Chloride: 107 mmol/L (ref 98–111)
Creatinine, Ser: 0.91 mg/dL (ref 0.61–1.24)
GFR, Estimated: >60 mL/min (ref >60)
Glucose, Bld: 103 mg/dL — ABNORMAL HIGH (ref 70–99)
Potassium: 3.3 mmol/L — ABNORMAL LOW (ref 3.5–5.1)
Sodium: 140 mmol/L (ref 135–145)
Total Bilirubin: 0.6 mg/dL (ref 0.3–1.2)
Total Protein: 6.5 g/dL (ref 6.5–8.1)

## 2021-12-20 MED ORDER — CLONIDINE HCL 0.1 MG PO TABS: 0.1000 mg | ORAL_TABLET | Freq: Three times a day (TID) | ORAL | 0 refills | Status: AC

## 2021-12-20 NOTE — ED Notes (Signed)
TTS @ the bedside

## 2021-12-20 NOTE — ED Notes (Signed)
E-signature pad unavailable - Pt verbalized understanding of D/C information - no additional concerns at this time.  

## 2021-12-20 NOTE — Consult Note (Signed)
[facility] Consult   Reason for Consult: Withdrawal Symptoms Referring Physician: Dr. Starleen Blue Patient Identification: Ricky Campbell MRN:  [id number] Principal Diagnosis: <principal problem not specified> Diagnosis:  Active Problems:   Bipolar I disorder, most recent episode depressed ([place])   Cannabis use disorder, moderate, dependence ([place])   Opioid use disorder, moderate, dependence ([place])   Tobacco use disorder   Chronic post-traumatic stress disorder (PTSD)   Recurrent major depression-severe ([place])   Cocaine abuse in remission ([place])   Total Time spent with patient: 45 minutes  Subjective: "I need my medications."  Ricky Campbell is a 46 y.o. male patient presented to [facility] ED via POV voluntary. The patient reported to the triage nurse that he moved here from [place] and has not had his daily medications in 2 weeks (Cymbalta 90 mg daily, Prazosin 5 mg daily, Adderall 15 mg BID, Trazodone 100 mg qhs), not his Methadone 155 mg daily in 3 days.   The patient discussed with this Probation officer that he still has some of his medication that he is current. The patient voiced that it is a few medications that he needs. This Probation officer spoke with the pharmacy to have the tech review the patient's current medication status and what he is missing. However, the pharmacy voiced that they have only one tech on tonight and will be able to get to the patient medication reconciliation tomorrow.  This provider saw The patient face-to-face; the chart was reviewed, and consulted with Dr. Starleen Blue on 12/19/2021 due to the patient's care. It was discussed with the EDP that the patient does not meet the criteria to be admitted to the psychiatric inpatient unit.  On evaluation, the patient is alert and oriented x4, irritated due to him going through withdrawals but cooperative in answering questions, and mood-congruent with affect. The patient does not appear to be responding to internal or  external stimuli. Neither is the patient presenting with any delusional thinking. The patient denies auditory or visual hallucinations. The patient admits to passive suicidal ideation but homicidal or self-harm ideations. The patient is not presenting with any psychotic or paranoid behaviors. During an encounter with the patient, he could answer questions appropriately.  HPI: Per Dr. Starleen Blue, Ricky Campbell is a 46 y.o. male has medical history of bipolar disorder, PTSD, opiate use disorder who presents with withdrawal.  Patient moved from [place] and has been off of all his medication for the last 2 weeks.  Was on the methadone clinic getting 155 mg daily.  Tells me he has not had the methadone for over 2 weeks.  Initially stopped going to the methadone clinic in [place] and then has not been established here as of yet.  Started feeling sick about 3 days ago significant nausea vomiting body aches.  Also with generalized abdominal pain.  Says he wants to kill himself if he leaves here he will either overdose or hurt himself.  Past Psychiatric History:  Bipolar disorder ([place])      Chronic post-traumatic stress disorder (PTSD) 01/07/2016  Schizophrenia ([place])   Risk to Self:   Risk to Others:   Prior Inpatient Therapy:   Prior Outpatient Therapy:    Past Medical History:  Past Medical History:  Diagnosis Date   Bipolar disorder ([place])    Chronic neck and back pain    Chronic post-traumatic stress disorder (PTSD) 01/07/2016   Hidradenitis    Schizophrenia ([place])     Past Surgical History:  Procedure Laterality Date   HAND  SURGERY     Family History: No family history on file. Family Psychiatric  History:  Social History:  Social History   Substance and Sexual Activity  Alcohol Use No     Social History   Substance and Sexual Activity  Drug Use Yes   Frequency: 7.0 times per week   Types: Benzodiazepines, Heroin, Cocaine, Marijuana   Comment: uses daily, heroin; last use 2  weeks ago    Social History   Socioeconomic History   Marital status: Legally Separated    Spouse name: Not on file   Number of children: Not on file   Years of education: Not on file   Highest education level: Not on file  Occupational History   Not on file  Tobacco Use   Smoking status: Every Day    Packs/day: 1.00    Years: 15.00    Total pack years: 15.00    Types: Cigarettes   Smokeless tobacco: Never  Substance and Sexual Activity   Alcohol use: No   Drug use: Yes    Frequency: 7.0 times per week    Types: Benzodiazepines, Heroin, Cocaine, Marijuana    Comment: uses daily, heroin; last use 2 weeks ago   Sexual activity: Not on file  Other Topics Concern   Not on file  Social History Narrative   Not on file   Social Determinants of Health   Financial Resource Strain: Not on file  Food Insecurity: Not on file  Transportation Needs: Not on file  Physical Activity: Not on file  Stress: Not on file  Social Connections: Not on file   Additional Social History:    Allergies:   Allergies  Allergen Reactions   Tetracyclines & Related Swelling    Labs:  Results for orders placed or performed during the hospital encounter of 12/19/21 (from the past 48 hour(s))  Comprehensive metabolic panel     Status: Abnormal   Collection Time: 12/19/21 11:32 PM  Result Value Ref Range   Sodium 140 135 - 145 mmol/L   Potassium 3.3 (L) 3.5 - 5.1 mmol/L   Chloride 107 98 - 111 mmol/L   CO2 24 22 - 32 mmol/L   Glucose, Bld 103 (H) 70 - 99 mg/dL    Comment: Glucose reference range applies only to samples taken after fasting for at least 8 hours.   BUN 7 6 - 20 mg/dL   Creatinine, Ser 4.40 0.61 - 1.24 mg/dL   Calcium 8.2 (L) 8.9 - 10.3 mg/dL   Total Protein 6.5 6.5 - 8.1 g/dL   Albumin 3.7 3.5 - 5.0 g/dL   AST 15 15 - 41 U/L   ALT 12 0 - 44 U/L   Alkaline Phosphatase 70 38 - 126 U/L   Total Bilirubin 0.6 0.3 - 1.2 mg/dL   GFR, Estimated >34 >74 mL/min    Comment:  (NOTE) Calculated using the CKD-EPI Creatinine Equation (2021)    Anion gap 9 5 - 15    Comment: Performed at [facility], [address] [place], [place] [address]  CBC with Differential     Status: Abnormal   Collection Time: 12/19/21 11:32 PM  Result Value Ref Range   WBC 12.5 (H) 4.0 - 10.5 K/uL   RBC 4.82 4.22 - 5.81 MIL/uL   Hemoglobin 13.7 13.0 - 17.0 g/dL   HCT 38.7 56.4 - 33.2 %   MCV 88.8 80.0 - 100.0 fL   MCH 28.4 26.0 - 34.0 pg   MCHC 32.0  30.0 - 36.0 g/dL   RDW 13.4 11.5 - 15.5 %   Platelets 282 150 - 400 K/uL   nRBC 0.0 0.0 - 0.2 %   Neutrophils Relative % 64 %   Neutro Abs 8.2 (H) 1.7 - 7.7 K/uL   Lymphocytes Relative 27 %   Lymphs Abs 3.3 0.7 - 4.0 K/uL   Monocytes Relative 7 %   Monocytes Absolute 0.9 0.1 - 1.0 K/uL   Eosinophils Relative 1 %   Eosinophils Absolute 0.1 0.0 - 0.5 K/uL   Basophils Relative 1 %   Basophils Absolute 0.1 0.0 - 0.1 K/uL   Immature Granulocytes 0 %   Abs Immature Granulocytes 0.03 0.00 - 0.07 K/uL    Comment: Performed at [facility], [address], [place], [place] [address]  Urine Drug Screen, Qualitative ([place] only)     Status: Abnormal   Collection Time: 12/19/21 11:32 PM  Result Value Ref Range   Tricyclic, Ur Screen NONE DETECTED NONE DETECTED   Amphetamines, Ur Screen POSITIVE (A) NONE DETECTED   MDMA (Ecstasy)Ur Screen NONE DETECTED NONE DETECTED   Cocaine Metabolite,Ur [place] NONE DETECTED NONE DETECTED   Opiate, Ur Screen NONE DETECTED NONE DETECTED   Phencyclidine (PCP) Ur S NONE DETECTED NONE DETECTED   Cannabinoid 50 Ng, Ur  POSITIVE (A) NONE DETECTED   Barbiturates, Ur Screen POSITIVE (A) NONE DETECTED   Benzodiazepine, Ur Scrn NONE DETECTED NONE DETECTED   Methadone Scn, Ur POSITIVE (A) NONE DETECTED    Comment: (NOTE) Tricyclics + metabolites, urine    Cutoff 1000 ng/mL Amphetamines + metabolites, urine  Cutoff 1000 ng/mL MDMA (Ecstasy), urine              Cutoff 500 ng/mL Cocaine  Metabolite, urine          Cutoff 300 ng/mL Opiate + metabolites, urine        Cutoff 300 ng/mL Phencyclidine (PCP), urine         Cutoff 25 ng/mL Cannabinoid, urine                 Cutoff 50 ng/mL Barbiturates + metabolites, urine  Cutoff 200 ng/mL Benzodiazepine, urine              Cutoff 200 ng/mL Methadone, urine                   Cutoff 300 ng/mL  The urine drug screen provides only a preliminary, unconfirmed analytical test result and should not be used for non-medical purposes. Clinical consideration and professional judgment should be applied to any positive drug screen result due to possible interfering substances. A more specific alternate chemical method must be used in order to obtain a confirmed analytical result. Gas chromatography / mass spectrometry (GC/MS) is the preferred confirm atory method. Performed at [facility], [address] [place], [place] [address]   Resp Panel by RT-PCR (Flu A&B, Covid) Anterior Nasal Swab     Status: None   Collection Time: 12/19/21 11:33 PM   Specimen: Anterior Nasal Swab  Result Value Ref Range   SARS Coronavirus 2 by RT PCR NEGATIVE NEGATIVE    Comment: (NOTE) SARS-CoV-2 target nucleic acids are NOT DETECTED.  The SARS-CoV-2 RNA is generally detectable in upper respiratory specimens during the acute phase of infection. The lowest concentration of SARS-CoV-2 viral copies this assay can detect is 138 copies/mL. A negative result does not preclude SARS-Cov-2 infection and should not be used as the sole basis for treatment  or other patient management decisions. A negative result may occur with  improper specimen collection/handling, submission of specimen other than nasopharyngeal swab, presence of viral mutation(s) within the areas targeted by this assay, and inadequate number of viral copies(<138 copies/mL). A negative result must be combined with clinical observations, patient history, and  epidemiological information. The expected result is Negative.  Fact Sheet for Patients:  EntrepreneurPulse.com.au  Fact Sheet for Healthcare Providers:  IncredibleEmployment.be  This test is no t yet approved or cleared by the [place] FDA and  has been authorized for detection and/or diagnosis of SARS-CoV-2 by FDA under an Emergency Use Authorization (EUA). This EUA will remain  in effect (meaning this test can be used) for the duration of the COVID-19 declaration under Section 564(b)(1) of the Act, 21 U.S.C.section 360bbb-3(b)(1), unless the authorization is terminated  or revoked sooner.       Influenza A by PCR NEGATIVE NEGATIVE   Influenza B by PCR NEGATIVE NEGATIVE    Comment: (NOTE) The Xpert Xpress SARS-CoV-2/FLU/RSV plus assay is intended as an aid in the diagnosis of influenza from Nasopharyngeal swab specimens and should not be used as a sole basis for treatment. Nasal washings and aspirates are unacceptable for Xpert Xpress SARS-CoV-2/FLU/RSV testing.  Fact Sheet for Patients: EntrepreneurPulse.com.au  Fact Sheet for Healthcare Providers: IncredibleEmployment.be  This test is not yet approved or cleared by the [place] FDA and has been authorized for detection and/or diagnosis of SARS-CoV-2 by FDA under an Emergency Use Authorization (EUA). This EUA will remain in effect (meaning this test can be used) for the duration of the COVID-19 declaration under Section 564(b)(1) of the Act, 21 U.S.C. section 360bbb-3(b)(1), unless the authorization is terminated or revoked.  Performed at [facility], [address], [place], [place] [address]     Current Facility-Administered Medications  Medication Dose Route Frequency Provider Last Rate Last Admin   prazosin (MINIPRESS) capsule 5 mg  5 mg Oral QHS Rada Hay, MD   5 mg at 12/19/21 2329   traZODone (DESYREL) tablet  100 mg  100 mg Oral QHS Rada Hay, MD   100 mg at 12/19/21 2329   Current Outpatient Medications  Medication Sig Dispense Refill   cloNIDine (CATAPRES) 0.1 MG tablet Take 1 tablet (0.1 mg total) by mouth 3 (three) times daily. 30 tablet 0   gabapentin (NEURONTIN) 300 MG capsule Take 1 capsule (300 mg total) by mouth 2 (two) times daily. 60 capsule 1   haloperidol (HALDOL) 5 MG tablet Take 1 tablet (5 mg total) by mouth 2 (two) times daily as needed for agitation. 60 tablet 1   hydrOXYzine (ATARAX/VISTARIL) 25 MG tablet Take 1 tablet (25 mg total) by mouth every 6 (six) hours as needed for anxiety. 60 tablet 1   QUEtiapine (SEROQUEL) 200 MG tablet TAKE ONE TABLET BY MOUTH AT BEDTIME 30 tablet 1   traZODone (DESYREL) 100 MG tablet TAKE ONE TABLET BY MOUTH AT BEDTIME FOR SLEEP 30 tablet 1   venlafaxine XR (EFFEXOR-XR) 150 MG 24 hr capsule Take 1 capsule (150 mg total) by mouth daily with breakfast. 30 capsule 1    Musculoskeletal: Strength & Muscle Tone: within normal limits Gait & Station: normal Patient leans: N/A  Psychiatric Specialty Exam:  Presentation  General Appearance: Appropriate for Environment  Eye Contact:Minimal  Speech:Clear and Coherent  Speech Volume:Normal  Handedness:Right   Mood and Affect  Mood:Irritable  Affect:Appropriate   Thought Process  Thought Processes:Coherent  Descriptions of Associations:Intact  Orientation:Full (Time, Place and Person)  Thought Content:Logical  History of Schizophrenia/Schizoaffective disorder:No data recorded Duration of Psychotic Symptoms:No data recorded Hallucinations:Hallucinations: None  Ideas of Reference:None  Suicidal Thoughts:Suicidal Thoughts: Yes, Passive SI Passive Intent and/or Plan: Without Intent; Without Plan  Homicidal Thoughts:No data recorded  Sensorium  Memory:Immediate Good; Recent Good; Remote Good  Judgment:Fair  Insight:Fair   Executive Functions   Concentration:Fair  Attention Span:Fair  Recall:Fair  Fund of Knowledge:Fair  Language:Fair   Psychomotor Activity  Psychomotor Activity:Psychomotor Activity: Normal   Assets  Assets:Communication Skills; Desire for Improvement; Social Support; Resilience   Sleep  Sleep:Sleep: Good   Physical Exam: Physical Exam Vitals and nursing note reviewed.  Constitutional:      Appearance: Normal appearance.  HENT:     Head: Normocephalic and atraumatic.     Right Ear: External ear normal.     Left Ear: External ear normal.     Nose: Nose normal.  Cardiovascular:     Rate and Rhythm: Normal rate.     Pulses: Normal pulses.  Pulmonary:     Effort: Pulmonary effort is normal.  Musculoskeletal:        General: Normal range of motion.     Cervical back: Normal range of motion and neck supple.  Neurological:     General: No focal deficit present.     Mental Status: He is alert and oriented to person, place, and time.  Psychiatric:        Attention and Perception: Attention and perception normal.        Mood and Affect: Mood is depressed. Affect is blunt and flat.        Speech: Speech normal.        Behavior: Behavior normal. Behavior is cooperative.        Thought Content: Thought content includes suicidal ideation.        Cognition and Memory: Cognition and memory normal.        Judgment: Judgment normal.    Review of Systems  Psychiatric/Behavioral:  Positive for depression, substance abuse and suicidal ideas.   All other systems reviewed and are negative.  Blood pressure (!) 132/93, pulse 87, temperature 98.3 F (36.8 C), temperature source Oral, resp. rate 18, height 5\' 11"  (1.803 m), weight 90.7 kg, SpO2 99 %. Body mass index is 27.89 kg/m.  Treatment Plan Summary: Plan Patient does not meet criteria for psychiatric inpatient admission  Disposition: No evidence of imminent risk to self or others at present.   Patient does not meet criteria for psychiatric  inpatient admission. Supportive therapy provided about ongoing stressors. Discussed crisis plan, support from social network, calling 911, coming to the Emergency Department, and calling Suicide Hotline.  , NP 12/20/2021 3:17 AM

## 2023-10-15 DEATH — deceased
